# Patient Record
Sex: Male | Born: 1945 | Race: White | Hispanic: No | Marital: Married | Smoking: Former smoker
Health system: Southern US, Community
[De-identification: ages and names within clinical notes are randomized; demographics above are authoritative.]

## PROBLEM LIST (undated history)

## (undated) DIAGNOSIS — M1990 Unspecified osteoarthritis, unspecified site: Secondary | ICD-10-CM

## (undated) DIAGNOSIS — J189 Pneumonia, unspecified organism: Secondary | ICD-10-CM

## (undated) DIAGNOSIS — C4490 Unspecified malignant neoplasm of skin, unspecified: Secondary | ICD-10-CM

## (undated) DIAGNOSIS — R42 Dizziness and giddiness: Secondary | ICD-10-CM

## (undated) DIAGNOSIS — H9190 Unspecified hearing loss, unspecified ear: Secondary | ICD-10-CM

## (undated) DIAGNOSIS — K219 Gastro-esophageal reflux disease without esophagitis: Secondary | ICD-10-CM

## (undated) DIAGNOSIS — K5790 Diverticulosis of intestine, part unspecified, without perforation or abscess without bleeding: Secondary | ICD-10-CM

## (undated) DIAGNOSIS — I2510 Atherosclerotic heart disease of native coronary artery without angina pectoris: Secondary | ICD-10-CM

## (undated) DIAGNOSIS — Z8619 Personal history of other infectious and parasitic diseases: Secondary | ICD-10-CM

## (undated) DIAGNOSIS — I10 Essential (primary) hypertension: Secondary | ICD-10-CM

## (undated) HISTORY — DX: Dizziness and giddiness: R42

## (undated) HISTORY — PX: HAND SURGERY: SHX662

## (undated) HISTORY — PX: KNEE SURGERY: SHX244

## (undated) HISTORY — DX: Atherosclerotic heart disease of native coronary artery without angina pectoris: I25.10

## (undated) HISTORY — PX: SHOULDER SURGERY: SHX246

## (undated) HISTORY — PX: FOOT SURGERY: SHX648

## (undated) HISTORY — DX: Essential (primary) hypertension: I10

## (undated) HISTORY — DX: Unspecified malignant neoplasm of skin, unspecified: C44.90

## (undated) HISTORY — PX: ELBOW SURGERY: SHX618

---
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
## 1998-10-23 DIAGNOSIS — Z8619 Personal history of other infectious and parasitic diseases: Secondary | ICD-10-CM

## 1998-10-23 HISTORY — DX: Personal history of other infectious and parasitic diseases: Z86.19

## 2005-05-12 HISTORY — PX: HERNIA REPAIR: SHX51

## 2013-10-23 DIAGNOSIS — C4490 Unspecified malignant neoplasm of skin, unspecified: Secondary | ICD-10-CM

## 2013-10-23 HISTORY — PX: SKIN CANCER EXCISION: SHX779

## 2013-10-23 HISTORY — DX: Unspecified malignant neoplasm of skin, unspecified: C44.90

## 2018-05-14 ENCOUNTER — Encounter: Payer: Self-pay | Admitting: General Surgery

## 2018-05-29 ENCOUNTER — Encounter: Payer: Self-pay | Admitting: *Deleted

## 2018-06-04 ENCOUNTER — Encounter: Payer: Self-pay | Admitting: General Surgery

## 2018-06-04 ENCOUNTER — Ambulatory Visit: Payer: Medicare Other | Admitting: General Surgery

## 2018-06-04 VITALS — BP 132/76 | HR 65 | Resp 16 | Ht 71.0 in | Wt 209.0 lb

## 2018-06-04 DIAGNOSIS — K4091 Unilateral inguinal hernia, without obstruction or gangrene, recurrent: Secondary | ICD-10-CM | POA: Diagnosis not present

## 2018-06-04 NOTE — Patient Instructions (Addendum)
The patient is aware to call back for any questions or new concerns. Sign release for previous hernia surgery. [facility].  Inguinal Hernia, Adult An inguinal hernia is when fat or the intestines push through the area where the leg meets the lower belly (groin) and make a rounded lump (bulge). This condition happens over time. There are three types of inguinal hernias. These types include:  Hernias that can be pushed back into the belly (are reducible).  Hernias that cannot be pushed back into the belly (are incarcerated).  Hernias that cannot be pushed back into the belly and lose their blood supply (get strangulated). This type needs emergency surgery.  Follow these instructions at home: Lifestyle  Drink enough fluid to keep your urine (pee) clear or pale yellow.  Eat plenty of fruits, vegetables, and whole grains. These have a lot of fiber. Talk with your doctor if you have questions.  Avoid lifting heavy objects.  Avoid standing for long periods of time.  Do not use tobacco products. These include cigarettes, chewing tobacco, or e-cigarettes. If you need help quitting, ask your doctor.  Try to stay at a healthy weight. General instructions  Do not try to force the hernia back in.  Watch your hernia for any changes in color or size. Let your doctor know if there are any changes.  Take over-the-counter and prescription medicines only as told by your doctor.  Keep all follow-up visits as told by your doctor. This is important. Contact a doctor if:  You have a fever.  You have new symptoms.  Your symptoms get worse. Get help right away if:  The area where the legs meets the lower belly has: ? Pain that gets worse suddenly. ? A bulge that gets bigger suddenly and does not go down. ? A bulge that turns red or purple. ? A bulge that is painful to the touch.  You are a man and your scrotum: ? Suddenly feels painful. ? Suddenly changes in size.  You feel sick to  your stomach (nauseous) and this feeling does not go away.  You throw up (vomit) and this keeps happening.  You feel your heart beating a lot more quickly than normal.  You cannot poop (have a bowel movement) or pass gas. This information is not intended to replace advice given to you by your health care provider. Make sure you discuss any questions you have with your health care provider. Document Released: 11/09/2006 Document Revised: 03/16/2016 Document Reviewed: 08/19/2014 Elsevier Interactive Patient Education  [employer].   The patient is scheduled for surgery at [facility] on 06/17/18. He will pre admit by phone. The patient is aware of date and instructions.

## 2018-06-04 NOTE — Progress Notes (Signed)
Patient ID: Keith Davidson., male   DOB: 11-29-1945, 72 y.o.   MRN: [id number]  Chief Complaint  Patient presents with  . Hernia    HPI [name]. is a 72 y.o. male.  Patient here today for an evaluation of a possible hernia referred by Dr Elliot Gault.  States that he noticed left groin pain about one year ago that has progressively gotten worse.  It does seem to be causing some left groin pain described as a burning  "bee sting". He states he has been remodeling a pontoon boat and has noticed it more. He states the pain is worse with activity.  No nausea, vomiting, constipation or diarrhea noted. He is a retired Academic librarian).   HPI  Past Medical History:  Diagnosis Date  . CAD (coronary artery disease)   . Hypertension   . Skin cancer 2015   face  . Vertigo     Past Surgical History:  Procedure Laterality Date  . ELBOW SURGERY Right   . FOOT SURGERY Right    big toe  . HAND SURGERY Right    thumb and middle finger  . HERNIA REPAIR Left 05/12/2005   Laparoscopic, Proceed mesh, Milus Mallick, M.D.; [place], [place] Right    x2  . SHOULDER SURGERY Left   . SKIN CANCER EXCISION  2015   face    Family History  Problem Relation Age of Onset  . Heart attack Mother   . Heart disease Mother   . Heart disease Father   . Heart attack Father     Social History Social History   Tobacco Use  . Smoking status: Former Smoker    Years: 8.00    Types: Cigarettes    Last attempt to quit: 10/23/1978    Years since quitting: 39.6  . Smokeless tobacco: Never Used  Substance Use Topics  . Alcohol use: Yes    Comment: 2/day  . Drug use: Never    Allergies  Allergen Reactions  . Sulfa Antibiotics Rash    Current Outpatient Medications  Medication Sig Dispense Refill  . amLODipine (NORVASC) 5 MG tablet Take 5 mg by mouth daily.  1  . Betahistine HCl (BETAHISTINE DIHYDROCHLORIDE) POWD Take 24 mg by mouth daily.  [place] Compounds    . fenofibrate 54 MG tablet Take 54 mg by mouth daily.  3  . Methylcellulose, Laxative, (CITRUCEL PO) Take 1 tablet by mouth daily.     . naproxen (NAPROSYN) 500 MG tablet Take 500 mg by mouth daily.   3  . ondansetron (ZOFRAN) 4 MG tablet Take 4 mg by mouth every 8 (eight) hours as needed for nausea or vomiting.    . triamterene-hydrochlorothiazide (MAXZIDE-25) 37.5-25 MG tablet Take 1 tablet by mouth daily.  11  . diazepam (VALIUM) 2 MG tablet Take 2 mg by mouth 2 (two) times daily as needed (for dizziness/vertigo).    . Multiple Vitamin (MULTIVITAMIN WITH MINERALS) TABS tablet Take 1 tablet by mouth daily. Centrum Silver     No current facility-administered medications for this visit.     Review of Systems Review of Systems  Constitutional: Negative.   Respiratory: Negative.   Cardiovascular: Negative.   Gastrointestinal: Positive for abdominal pain. Negative for constipation, diarrhea, nausea and vomiting.    Blood pressure 132/76, pulse 65, resp. rate 16, height 5\' 11"  (1.803 m), weight 209 lb (94.8 kg), SpO2 94 %.  Physical Exam Physical Exam  Constitutional: He is oriented to person, place, and time. He appears well-developed and well-nourished.  HENT:  Mouth/Throat: No oropharyngeal exudate.  Eyes: Conjunctivae are normal. No scleral icterus.  Neck: Neck supple.  Cardiovascular: Normal rate, regular rhythm and normal heart sounds.  Pulmonary/Chest: Effort normal and breath sounds normal.  Abdominal: Soft. A hernia is present. Hernia confirmed positive in the left inguinal area.  Genitourinary: Testes normal.     Neurological: He is alert and oriented to person, place, and time.  Skin: Skin is warm and dry.  Psychiatric: His behavior is normal.    Data Reviewed Operative note from the May 04, 2005 procedure describes a large left inguinal hernia including the sigmoid colon.  A Proceed mesh was placed followed by coverage with the  peritoneum (TAPP procedure). Laboratory studies dated June 18, 2017 showed a creatinine of 1.0 with an estimated GFR of 60.  Normal electrolytes with a potassium of 4.1.  Blood sugar of 94.  CBC of the same date showed a hemoglobin of 15.4 with an MCV of 88, white blood cell count of 6400 with normal differential.  Platelet count of 277,000.  ESA of 0.8.  Liver panel: Normal.  Assessment    Recurrent left inguinal hernia.    Plan    Sign release for previous hernia surgery, [facility].  ( records received)  Hernia precautions and incarceration were discussed with the patient. If they develop symptoms of an incarcerated hernia, they were encouraged to seek prompt medical attention.  I have recommended repair of the hernia using mesh on an outpatient basis in the near future. The risk of infection was reviewed. The role of prosthetic mesh to minimize the risk of recurrence was reviewed.     HPI, Physical Exam, Assessment and Plan have been scribed under the direction and in the presence of Robert Bellow, MD. Karie Fetch, RN  I have completed the exam and reviewed the above documentation for accuracy and completeness.  I agree with the above.  Haematologist has been used and any errors in dictation or transcription are unintentional.  Hervey Ard, M.D., F.A.C.S.  The patient is scheduled for surgery at [facility] on 06/17/18. He will pre admit by phone. The patient is aware of date and instructions.  Documented by Caryl-Lyn Otis Brace LPN  [name] Wenonah Milo 06/05/2018, 6:26 PM

## 2018-06-05 ENCOUNTER — Encounter: Payer: Self-pay | Admitting: General Surgery

## 2018-06-05 DIAGNOSIS — K4091 Unilateral inguinal hernia, without obstruction or gangrene, recurrent: Secondary | ICD-10-CM | POA: Insufficient documentation

## 2018-06-07 ENCOUNTER — Encounter
Admission: RE | Admit: 2018-06-07 | Discharge: 2018-06-07 | Disposition: A | Discharge status: Home / Self Care | Payer: Medicare Other | Source: Ambulatory Visit | Attending: General Surgery | Admitting: General Surgery

## 2018-06-07 ENCOUNTER — Other Ambulatory Visit: Payer: Self-pay

## 2018-06-07 ENCOUNTER — Inpatient Hospital Stay: Admission: RE | Admit: 2018-06-07 | Payer: Self-pay | Source: Ambulatory Visit

## 2018-06-07 DIAGNOSIS — Z01818 Encounter for other preprocedural examination: Secondary | ICD-10-CM | POA: Insufficient documentation

## 2018-06-07 NOTE — Patient Instructions (Signed)
Your procedure is scheduled on: 06-17-18 MONDAY Report to Same Day Surgery 2nd floor medical mall [facility] Entrance-take elevator on left to 2nd floor.  Check in with surgery information desk.) To find out your arrival time please call [phone] between 1PM - 3PM on 06-14-18 FRIDAY  Remember: Instructions that are not followed completely may result in serious medical risk, up to and including death, or upon the discretion of your surgeon and anesthesiologist your surgery may need to be rescheduled.    _x___ 1. Do not eat food after midnight the night before your procedure. NO GUM OR CANDY AFTER MIDNIGHT.  You may drink clear liquids up to 2 hours before you are scheduled to arrive at the hospital for your procedure.  Do not drink clear liquids within 2 hours of your scheduled arrival to the hospital.  Clear liquids include  --Water or Apple juice without pulp  --Clear carbohydrate beverage such as ClearFast or Gatorade  --Black Coffee or Clear Tea (No milk, no creamers, do not add anything to the coffee or Tea   ____Ensure clear carbohydrate drink on the way to the hospital for bariatric patients  ____Ensure clear carbohydrate drink 3 hours before surgery for Dr Dwyane Luo patients if physician instructed.     __x__ 2. No Alcohol for 24 hours before or after surgery.   __x__3. No Smoking or e-cigarettes for 24 prior to surgery.  Do not use any chewable tobacco products for at least 6 hour prior to surgery   ____  4. Bring all medications with you on the day of surgery if instructed.    __x__ 5. Notify your doctor if there is any change in your medical condition     (cold, fever, infections).    x___6. On the morning of surgery brush your teeth with toothpaste and water.  You may rinse your mouth with mouth wash if you wish.  Do not swallow any toothpaste or mouthwash.   Do not wear jewelry, make-up, hairpins, clips or nail polish.  Do not wear lotions, powders, or perfumes. You  may wear deodorant.  Do not shave 48 hours prior to surgery. Men may shave face and neck.  Do not bring valuables to the hospital.    [facility] is not responsible for any belongings or valuables.               Contacts, dentures or bridgework may not be worn into surgery.  Leave your suitcase in the car. After surgery it may be brought to your room.  For patients admitted to the hospital, discharge time is determined by your  treatment team.  _  Patients discharged the day of surgery will not be allowed to drive home.  You will need someone to drive you home and stay with you the night of your procedure.    Please read over the following fact sheets that you were given:   [facility] Preparing for Surgery and or MRSA Information   _x___ TAKE THE FOLLOWING MEDICATION THE MORNING OF SURGERY WITH A SMALL SIP OF WATER. These include:  1. AMLODIPINE (NORVASC)  2. FENOFIBRATE  3.  4.  5.  6.  ____Fleets enema or Magnesium Citrate as directed.   _x___ Use CHG Soap or sage wipes as directed on instruction sheet   ____ Use inhalers on the day of surgery and bring to hospital day of surgery  ____ Stop Metformin and Janumet 2 days prior to surgery.    ____ Take 1/2  of usual insulin dose the night before surgery and none on the morning surgery.   ____ Follow recommendations from Cardiologist, Pulmonologist or PCP regarding stopping Aspirin, Coumadin, Plavix ,Eliquis, Effient, or Pradaxa, and Pletal.  ____Stop Anti-inflammatories such as Advil, Aleve, Ibuprofen, Motrin, Naproxen, Naprosyn, Goodies powders or aspirin products. OK to take Tylenol    ____ Stop supplements until after surgery.     ____ Bring C-Pap to the hospital.

## 2018-06-10 ENCOUNTER — Inpatient Hospital Stay: Admission: RE | Admit: 2018-06-10 | Payer: Self-pay | Source: Ambulatory Visit

## 2018-06-11 ENCOUNTER — Encounter
Admission: RE | Admit: 2018-06-11 | Discharge: 2018-06-11 | Disposition: A | Discharge status: Home / Self Care | Payer: Medicare Other | Source: Ambulatory Visit | Attending: General Surgery | Admitting: General Surgery

## 2018-06-11 ENCOUNTER — Ambulatory Visit: Payer: Self-pay | Admitting: General Surgery

## 2018-06-11 DIAGNOSIS — Z01812 Encounter for preprocedural laboratory examination: Secondary | ICD-10-CM | POA: Insufficient documentation

## 2018-06-11 DIAGNOSIS — I10 Essential (primary) hypertension: Secondary | ICD-10-CM | POA: Insufficient documentation

## 2018-06-11 DIAGNOSIS — I2510 Atherosclerotic heart disease of native coronary artery without angina pectoris: Secondary | ICD-10-CM | POA: Insufficient documentation

## 2018-06-11 DIAGNOSIS — Z01810 Encounter for preprocedural cardiovascular examination: Secondary | ICD-10-CM | POA: Insufficient documentation

## 2018-06-11 LAB — CBC
HEMATOCRIT: 45.7 % (ref 40.0–52.0)
Hemoglobin: 15.8 g/dL (ref 13.0–18.0)
MCH: 30.9 pg (ref 26.0–34.0)
MCHC: 34.5 g/dL (ref 32.0–36.0)
MCV: 89.5 fL (ref 80.0–100.0)
Platelets: 276 10*3/uL (ref 150–440)
RBC: 5.11 MIL/uL (ref 4.40–5.90)
RDW: 13.4 % (ref 11.5–14.5)
WBC: 7.7 10*3/uL (ref 3.8–10.6)

## 2018-06-11 LAB — BASIC METABOLIC PANEL
ANION GAP: 7 (ref 5–15)
BUN: 21 mg/dL (ref 8–23)
CHLORIDE: 102 mmol/L (ref 98–111)
CO2: 30 mmol/L (ref 22–32)
Calcium: 9.7 mg/dL (ref 8.9–10.3)
Creatinine, Ser: 1.29 mg/dL — ABNORMAL HIGH (ref 0.61–1.24)
GFR calc non Af Amer: 54 mL/min — ABNORMAL LOW (ref >60)
GLUCOSE: 97 mg/dL (ref 70–99)
POTASSIUM: 4.1 mmol/L (ref 3.5–5.1)
Sodium: 139 mmol/L (ref 135–145)

## 2018-06-14 ENCOUNTER — Telehealth: Payer: Self-pay | Admitting: *Deleted

## 2018-06-14 NOTE — Telephone Encounter (Signed)
Notified patient as instructed, patient agrees. Patients labs was faxed to PCP

## 2018-06-14 NOTE — Telephone Encounter (Signed)
-----   Message from Robert Bellow, MD sent at 06/14/2018  8:46 AM EDT ----- Forward copy of labs to PCP. Notify patient kidney function down a little from last year. He will need to discuss w/ his PCP post hernia repair.  ----- Message ----- From: Interface, Lab In [facility] [place] Sent: 06/11/2018   1:02 PM EDT To: Robert Bellow, MD

## 2018-06-16 MED ORDER — CEFAZOLIN SODIUM-DEXTROSE 2-4 GM/100ML-% IV SOLN: 2.0000 g | INTRAVENOUS | Status: AC

## 2018-06-17 ENCOUNTER — Ambulatory Visit: Payer: Medicare Other | Admitting: Anesthesiology

## 2018-06-17 ENCOUNTER — Other Ambulatory Visit: Payer: Self-pay

## 2018-06-17 ENCOUNTER — Encounter
Admission: RE | Disposition: A | Discharge status: Home / Self Care | Payer: Self-pay | Source: Ambulatory Visit | Attending: General Surgery

## 2018-06-17 ENCOUNTER — Ambulatory Visit
Admission: RE | Admit: 2018-06-17 | Discharge: 2018-06-17 | Disposition: A | Discharge status: Home / Self Care | Payer: Medicare Other | Source: Ambulatory Visit | Attending: General Surgery | Admitting: General Surgery

## 2018-06-17 DIAGNOSIS — I2510 Atherosclerotic heart disease of native coronary artery without angina pectoris: Secondary | ICD-10-CM | POA: Insufficient documentation

## 2018-06-17 DIAGNOSIS — D176 Benign lipomatous neoplasm of spermatic cord: Secondary | ICD-10-CM | POA: Insufficient documentation

## 2018-06-17 DIAGNOSIS — Z85828 Personal history of other malignant neoplasm of skin: Secondary | ICD-10-CM | POA: Diagnosis not present

## 2018-06-17 DIAGNOSIS — K4090 Unilateral inguinal hernia, without obstruction or gangrene, not specified as recurrent: Secondary | ICD-10-CM

## 2018-06-17 DIAGNOSIS — Z79899 Other long term (current) drug therapy: Secondary | ICD-10-CM | POA: Diagnosis not present

## 2018-06-17 DIAGNOSIS — R42 Dizziness and giddiness: Secondary | ICD-10-CM | POA: Insufficient documentation

## 2018-06-17 DIAGNOSIS — Z8249 Family history of ischemic heart disease and other diseases of the circulatory system: Secondary | ICD-10-CM | POA: Insufficient documentation

## 2018-06-17 DIAGNOSIS — I10 Essential (primary) hypertension: Secondary | ICD-10-CM | POA: Diagnosis not present

## 2018-06-17 DIAGNOSIS — K4091 Unilateral inguinal hernia, without obstruction or gangrene, recurrent: Secondary | ICD-10-CM

## 2018-06-17 HISTORY — PX: INGUINAL HERNIA REPAIR: SHX194

## 2018-06-17 SURGERY — REPAIR, HERNIA, INGUINAL, ADULT
Anesthesia: General | Laterality: Left | Wound class: Clean

## 2018-06-17 MED ORDER — FENTANYL CITRATE (PF) 100 MCG/2ML IJ SOLN
INTRAMUSCULAR | Status: AC
  Filled 2018-06-17: qty 2

## 2018-06-17 MED ORDER — ONDANSETRON HCL 4 MG/2ML IJ SOLN
INTRAMUSCULAR | Status: DC | PRN
  Administered 2018-06-17: 4 mg via INTRAVENOUS

## 2018-06-17 MED ORDER — BUPIVACAINE-EPINEPHRINE (PF) 0.5% -1:200000 IJ SOLN
INTRAMUSCULAR | Status: DC | PRN
  Administered 2018-06-17: 30 mL

## 2018-06-17 MED ORDER — MIDAZOLAM HCL 2 MG/2ML IJ SOLN
INTRAMUSCULAR | Status: DC | PRN
  Administered 2018-06-17: 2 mg via INTRAVENOUS

## 2018-06-17 MED ORDER — GABAPENTIN 300 MG PO CAPS
300.0000 mg | ORAL_CAPSULE | ORAL | Status: AC
  Administered 2018-06-17: 300 mg via ORAL

## 2018-06-17 MED ORDER — FENTANYL CITRATE (PF) 100 MCG/2ML IJ SOLN
INTRAMUSCULAR | Status: DC | PRN
  Administered 2018-06-17: 50 ug via INTRAVENOUS

## 2018-06-17 MED ORDER — CEFAZOLIN SODIUM-DEXTROSE 1-4 GM/50ML-% IV SOLN
INTRAVENOUS | Status: DC | PRN
  Administered 2018-06-17: 2 g via INTRAVENOUS

## 2018-06-17 MED ORDER — ACETAMINOPHEN 10 MG/ML IV SOLN
INTRAVENOUS | Status: DC | PRN
  Administered 2018-06-17: 1000 mg via INTRAVENOUS

## 2018-06-17 MED ORDER — OXYCODONE HCL 5 MG PO TABS: 5.0000 mg | ORAL_TABLET | Freq: Once | ORAL | Status: DC | PRN

## 2018-06-17 MED ORDER — LACTATED RINGERS IV SOLN
INTRAVENOUS | Status: DC
  Administered 2018-06-17 (×2): via INTRAVENOUS

## 2018-06-17 MED ORDER — GABAPENTIN 300 MG PO CAPS
ORAL_CAPSULE | ORAL | Status: AC
  Filled 2018-06-17: qty 1

## 2018-06-17 MED ORDER — CELECOXIB 200 MG PO CAPS: 200.0000 mg | ORAL_CAPSULE | ORAL | Status: DC

## 2018-06-17 MED ORDER — HYDROCODONE-ACETAMINOPHEN 5-325 MG PO TABS: 1.0000 | ORAL_TABLET | ORAL | 0 refills | Status: DC | PRN

## 2018-06-17 MED ORDER — BUPIVACAINE-EPINEPHRINE (PF) 0.5% -1:200000 IJ SOLN
INTRAMUSCULAR | Status: AC
  Filled 2018-06-17: qty 30

## 2018-06-17 MED ORDER — PROPOFOL 10 MG/ML IV BOLUS
INTRAVENOUS | Status: DC | PRN
  Administered 2018-06-17: 120 mg via INTRAVENOUS

## 2018-06-17 MED ORDER — OXYCODONE HCL 5 MG/5ML PO SOLN: 5.0000 mg | Freq: Once | ORAL | Status: DC | PRN

## 2018-06-17 MED ORDER — FAMOTIDINE 20 MG PO TABS
20.0000 mg | ORAL_TABLET | Freq: Once | ORAL | Status: AC
  Administered 2018-06-17: 20 mg via ORAL

## 2018-06-17 MED ORDER — PROPOFOL 10 MG/ML IV BOLUS
INTRAVENOUS | Status: AC
  Filled 2018-06-17: qty 20

## 2018-06-17 MED ORDER — ACETAMINOPHEN 10 MG/ML IV SOLN
INTRAVENOUS | Status: AC
  Filled 2018-06-17: qty 100

## 2018-06-17 MED ORDER — FENTANYL CITRATE (PF) 100 MCG/2ML IJ SOLN: 25.0000 ug | INTRAMUSCULAR | Status: DC | PRN

## 2018-06-17 MED ORDER — LIDOCAINE HCL (CARDIAC) PF 100 MG/5ML IV SOSY
PREFILLED_SYRINGE | INTRAVENOUS | Status: DC | PRN
  Administered 2018-06-17: 40 mg via INTRAVENOUS

## 2018-06-17 MED ORDER — FAMOTIDINE 20 MG PO TABS
ORAL_TABLET | ORAL | Status: AC
  Filled 2018-06-17: qty 1

## 2018-06-17 MED ORDER — MIDAZOLAM HCL 2 MG/2ML IJ SOLN
INTRAMUSCULAR | Status: AC
  Filled 2018-06-17: qty 2

## 2018-06-17 SURGICAL SUPPLY — 33 items
BLADE SURG 15 STRL SS SAFETY (BLADE) ×6 IMPLANT
CANISTER SUCT 1200ML W/VALVE (MISCELLANEOUS) ×3 IMPLANT
CHLORAPREP W/TINT 26ML (MISCELLANEOUS) ×3 IMPLANT
CLOSURE WOUND 1/2 X4 (GAUZE/BANDAGES/DRESSINGS) ×1
DECANTER SPIKE VIAL GLASS SM (MISCELLANEOUS) ×3 IMPLANT
DRAIN PENROSE 1/4X12 LTX (DRAIN) ×3 IMPLANT
DRAPE LAPAROTOMY 100X77 ABD (DRAPES) ×3 IMPLANT
DRSG TEGADERM 4X4.75 (GAUZE/BANDAGES/DRESSINGS) ×3 IMPLANT
DRSG TELFA 4X3 1S NADH ST (GAUZE/BANDAGES/DRESSINGS) ×3 IMPLANT
ELECT REM PT RETURN 9FT ADLT (ELECTROSURGICAL) ×3
ELECTRODE REM PT RTRN 9FT ADLT (ELECTROSURGICAL) ×1 IMPLANT
GLOVE BIO SURGEON STRL SZ7.5 (GLOVE) ×3 IMPLANT
GLOVE INDICATOR 8.0 STRL GRN (GLOVE) ×3 IMPLANT
GOWN STRL REUS W/ TWL LRG LVL3 (GOWN DISPOSABLE) ×2 IMPLANT
GOWN STRL REUS W/TWL LRG LVL3 (GOWN DISPOSABLE) ×4
KIT TURNOVER KIT A (KITS) ×3 IMPLANT
LABEL OR SOLS (LABEL) ×3 IMPLANT
MESH MARLEX PLUG MEDIUM (Mesh General) ×3 IMPLANT
NEEDLE HYPO 22GX1.5 SAFETY (NEEDLE) ×6 IMPLANT
PACK BASIN MINOR ARMC (MISCELLANEOUS) ×3 IMPLANT
STRIP CLOSURE SKIN 1/2X4 (GAUZE/BANDAGES/DRESSINGS) ×2 IMPLANT
SUT PDS AB 0 CT1 27 (SUTURE) ×3 IMPLANT
SUT SURGILON 0 BLK (SUTURE) ×6 IMPLANT
SUT VIC AB 2-0 SH 27 (SUTURE) ×2
SUT VIC AB 2-0 SH 27XBRD (SUTURE) ×1 IMPLANT
SUT VIC AB 3-0 54X BRD REEL (SUTURE) ×1 IMPLANT
SUT VIC AB 3-0 BRD 54 (SUTURE) ×2
SUT VIC AB 3-0 SH 27 (SUTURE) ×2
SUT VIC AB 3-0 SH 27X BRD (SUTURE) ×1 IMPLANT
SUT VIC AB 4-0 FS2 27 (SUTURE) ×3 IMPLANT
SWABSTK COMLB BENZOIN TINCTURE (MISCELLANEOUS) ×3 IMPLANT
SYR 10ML LL (SYRINGE) ×3 IMPLANT
SYR 3ML LL SCALE MARK (SYRINGE) ×3 IMPLANT

## 2018-06-17 NOTE — Op Note (Signed)
Preoperative diagnosis: Left inguinal hernia.  Postoperative diagnosis: Large lipoma the cord.  Operative procedure: Excision of lipoma the cord, buttressing of the inguinal canal with a medium PerFix plug and mesh.  Operating Surgeon: Hervey Ard, MD.  Anesthesia: General by LMA, Marcaine 0.5% with 1 to 200,000 units of epinephrine, 30 cc.  Clinical note: This 72 year old male is developed a bulge in the left groin and exam consistent with an inguinal hernia.  He is mainly had burning discomfort thought secondary to traction on the ilioinguinal and iliohypogastric nerves.  He was admitted for elective repair.  He received Kefzol prior to the procedure.  Operative note: Hair would previously been removed from the surgical site.  SCD stockings for DVT prevention.  The abdomen was cleansed with ChloraPrep and draped.  A 5 cm skin line incision was made over the anticipated course the inguinal canal after a field block anesthetic was completed.  The skin was incised sharply.  Hemostasis was electrocautery and 3-0 Vicryl ties.  The external Bleich was opened in the direction of its fibers.  There was a very large lipoma the cord which was dissected back to the level of the internal inguinal ring at which point it was cleared, divided between [employer] clamps and hemostasis achieved with 3-0 Vicryl ties.  Dissection of the cord showed no indirect sac.  The medial floor the inguinal canal was intact.  A PerFix plug was placed through the internal ring and anchored with a single 0 Surgilon figure-of-eight sutures.  An onlay mesh was then placed anchored to the pubic tubercle in a similar fashion with 0 Surgilon.  The inferior border was anchored to the inguinal ligament and the superior medial aspects of the transverse abdominis aponeurosis.  Toradol was placed in the wound for postoperative analgesia.  The external Bleich was closed with a running 2-0 Vicryl suture.  Scarpa's fascia was closed with a running  3-0 Vicryl suture.  The skin closed with a running 4-0 Vicryl subcuticular suture.  Benzoin, Steri-Strips followed by Telfa and Tegaderm dressing applied.  The patient tolerated the procedure well and was taken to the recovery room in stable condition.

## 2018-06-17 NOTE — Progress Notes (Signed)
Dr. Bary Castilla into see

## 2018-06-17 NOTE — Transfer of Care (Signed)
Immediate Anesthesia Transfer of Care Note  Patient: Keith Davidson.  Procedure(s) Performed: HERNIA REPAIR INGUINAL ADULT (Left )  Patient Location: PACU  Anesthesia Type:General  Level of Consciousness: drowsy  Airway & Oxygen Therapy: Patient Spontanous Breathing and Patient connected to face mask oxygen  Post-op Assessment: Report given to RN and Post -op Vital signs reviewed and stable  Post vital signs: Reviewed and stable  Last Vitals:  Vitals Value Taken Time  BP 129/75 06/17/2018  1:26 PM  Temp    Pulse 68 06/17/2018  1:27 PM  Resp 13 06/17/2018  1:27 PM  SpO2 100 % 06/17/2018  1:27 PM  Vitals shown include unvalidated device data.  Last Pain:  Vitals:   06/17/18 1324  TempSrc:   PainSc: (P) 0-No pain         Complications: No apparent anesthesia complications

## 2018-06-17 NOTE — H&P (Signed)
No change in clinical history or exam. For left inguinal hernia repair.

## 2018-06-17 NOTE — Anesthesia Preprocedure Evaluation (Signed)
Anesthesia Evaluation  Patient identified by MRN, date of birth, ID band Patient awake    Reviewed: Allergy & Precautions, H&P , NPO status , Patient's Chart, lab work & pertinent test results  History of Anesthesia Complications Negative for: history of anesthetic complications  Airway Mallampati: II  TM Distance: <3 FB Neck ROM: limited    Dental  (+) Chipped, Poor Dentition   Pulmonary neg shortness of breath, former smoker,           Cardiovascular Exercise Tolerance: Good hypertension, (-) angina+ CAD  (-) DOE      Neuro/Psych negative neurological ROS  negative psych ROS   GI/Hepatic negative GI ROS, Neg liver ROS,   Endo/Other  negative endocrine ROS  Renal/GU      Musculoskeletal   Abdominal   Peds  Hematology negative hematology ROS (+)   Anesthesia Other Findings Past Medical History: No date: CAD (coronary artery disease) No date: Hypertension 2015: Skin cancer     Comment:  face No date: Vertigo  Past Surgical History: No date: ELBOW SURGERY; Right No date: FOOT SURGERY; Right     Comment:  big toe No date: HAND SURGERY; Right     Comment:  thumb and middle finger 05/12/2005: HERNIA REPAIR; Left     Comment:  Laparoscopic, Proceed mesh, Milus Mallick, M.D.; [place], [place] No date: KNEE SURGERY; Right     Comment:  x2 No date: SHOULDER SURGERY; Left 2015: SKIN CANCER EXCISION     Comment:  face  BMI    Body Mass Index:  29.15 kg/m      Reproductive/Obstetrics negative OB ROS                             Anesthesia Physical Anesthesia Plan  ASA: III  Anesthesia Plan: General LMA   Post-op Pain Management:    Induction: Intravenous  PONV Risk Score and Plan: Ondansetron, Dexamethasone, Midazolam and Treatment may vary due to age or medical condition  Airway Management Planned: LMA  Additional Equipment:   Intra-op  Plan:   Post-operative Plan: Extubation in OR  Informed Consent: I have reviewed the patients History and Physical, chart, labs and discussed the procedure including the risks, benefits and alternatives for the proposed anesthesia with the patient or authorized representative who has indicated his/her understanding and acceptance.   Dental Advisory Given  Plan Discussed with: Anesthesiologist, CRNA and Surgeon  Anesthesia Plan Comments: (Patient consented for risks of anesthesia including but not limited to:  - adverse reactions to medications - damage to teeth, lips or other oral mucosa - sore throat or hoarseness - Damage to heart, brain, lungs or loss of life  Patient voiced understanding.)        Anesthesia Quick Evaluation

## 2018-06-17 NOTE — Discharge Instructions (Signed)

## 2018-06-17 NOTE — Anesthesia Post-op Follow-up Note (Signed)
Anesthesia QCDR form completed.        

## 2018-06-17 NOTE — Anesthesia Procedure Notes (Signed)
Procedure Name: LMA Insertion Date/Time: 06/17/2018 12:26 PM Performed by: Gentry Fitz, CRNA Pre-anesthesia Checklist: Patient identified, Emergency Drugs available, Suction available, Patient being monitored and Timeout performed Patient Re-evaluated:Patient Re-evaluated prior to induction Oxygen Delivery Method: Circle system utilized Preoxygenation: Pre-oxygenation with 100% oxygen Induction Type: IV induction Ventilation: Mask ventilation without difficulty Placement Confirmation: positive ETCO2 Dental Injury: Teeth and Oropharynx as per pre-operative assessment

## 2018-06-18 ENCOUNTER — Encounter: Payer: Self-pay | Admitting: General Surgery

## 2018-06-18 NOTE — Anesthesia Postprocedure Evaluation (Signed)
Anesthesia Post Note  Patient: Keith Davidson.  Procedure(s) Performed: HERNIA REPAIR INGUINAL ADULT (Left )  Patient location during evaluation: PACU Anesthesia Type: General Level of consciousness: awake and alert Pain management: pain level controlled Vital Signs Assessment: post-procedure vital signs reviewed and stable Respiratory status: spontaneous breathing, nonlabored ventilation, respiratory function stable and patient connected to nasal cannula oxygen Cardiovascular status: blood pressure returned to baseline and stable Postop Assessment: no apparent nausea or vomiting Anesthetic complications: no     Last Vitals:  Vitals:   06/17/18 1449 06/17/18 1514  BP: 125/73 125/77  Pulse: 64 65  Resp: 16   Temp: (!) 35.9 C   SpO2: 94% 98%    Last Pain:  Vitals:   06/17/18 1514  TempSrc:   PainSc: 0-No pain                 Precious Haws Taneasha Fuqua

## 2018-06-20 ENCOUNTER — Ambulatory Visit: Payer: Self-pay | Admitting: General Surgery

## 2018-06-25 ENCOUNTER — Encounter: Payer: Self-pay | Admitting: General Surgery

## 2018-06-25 ENCOUNTER — Ambulatory Visit (INDEPENDENT_AMBULATORY_CARE_PROVIDER_SITE_OTHER): Payer: Medicare Other | Admitting: General Surgery

## 2018-06-25 VITALS — BP 128/70 | HR 70 | Resp 12 | Ht 71.0 in | Wt 213.0 lb

## 2018-06-25 DIAGNOSIS — D171 Benign lipomatous neoplasm of skin and subcutaneous tissue of trunk: Secondary | ICD-10-CM

## 2018-06-25 NOTE — Progress Notes (Signed)
Patient ID: Keith Craun., male   DOB: [birth year], 72 y.o.   MRN: [id number]  Chief Complaint  Patient presents with  . Routine Post Op    HPI Keith Blazier. is a 72 y.o. male here today for his post op left inguinal hernia repair done on 06/17/2018. Patient states he is very sore and it feel like a sharpe stabbing pain in his left inguinal area. Moving his bowels daily. He has been using a heating pad daily.   Past Medical History:  Diagnosis Date  . CAD (coronary artery disease)   . Hypertension   . Skin cancer 2015   face  . Vertigo     Past Surgical History:  Procedure Laterality Date  . ELBOW SURGERY Right   . FOOT SURGERY Right    big toe  . HAND SURGERY Right    thumb and middle finger  . HERNIA REPAIR Left 05/12/2005   Laparoscopic, Proceed mesh, Milus Mallick, M.D.; [place], [place] Left 06/17/2018   Left groin exploration with resection of large lipoma the cord.  Medium PerFix plug Surgeon: Robert Bellow, MD;  Location: [facility] ORS;  Service: General;  Laterality: Left;  . KNEE SURGERY Right    x2  . SHOULDER SURGERY Left   . SKIN CANCER EXCISION  2015   face    Family History  Problem Relation Age of Onset  . Heart attack Mother   . Heart disease Mother   . Heart disease Father   . Heart attack Father     Social History Social History   Tobacco Use  . Smoking status: Former Smoker    Years: 8.00    Types: Cigarettes    Last attempt to quit: 10/23/1978    Years since quitting: 39.6  . Smokeless tobacco: Never Used  . Tobacco comment: weekends only  Substance Use Topics  . Alcohol use: Yes    Comment: wine 3-4 times week  . Drug use: Never    Allergies  Allergen Reactions  . Celebrex [Celecoxib] Rash  . Sulfa Antibiotics Rash    Current Outpatient Medications  Medication Sig Dispense Refill  . amLODipine (NORVASC) 5 MG tablet Take 5 mg by mouth every morning.   1  . Betahistine HCl  (BETAHISTINE DIHYDROCHLORIDE) POWD Take 24 mg by mouth daily. [place] Compounds    . diazepam (VALIUM) 2 MG tablet Take 2 mg by mouth 2 (two) times daily as needed (for dizziness/vertigo).    . fenofibrate 54 MG tablet Take 54 mg by mouth every morning.   3  . Methylcellulose, Laxative, (CITRUCEL PO) Take 1 tablet by mouth daily.     . Multiple Vitamin (MULTIVITAMIN WITH MINERALS) TABS tablet Take 1 tablet by mouth daily. Centrum Silver    . naproxen (NAPROSYN) 500 MG tablet Take 500 mg by mouth daily.   3  . ondansetron (ZOFRAN) 4 MG tablet Take 4 mg by mouth every 8 (eight) hours as needed for nausea or vomiting.    . triamterene-hydrochlorothiazide (MAXZIDE-25) 37.5-25 MG tablet Take 1 tablet by mouth daily.  11   No current facility-administered medications for this visit.     Review of Systems Review of Systems  Constitutional: Negative.   Respiratory: Negative.   Cardiovascular: Negative.     Blood pressure 128/70, pulse 70, resp. rate 12, height 5\' 11"  (1.803 m), weight 213 lb (96.6 kg).  Physical Exam Physical Exam  Constitutional: He is oriented  to person, place, and time. He appears well-developed and well-nourished.  Genitourinary:     Neurological: He is alert and oriented to person, place, and time.  Skin: Skin is warm and dry.    Data Reviewed Careful dissection of the internal ring showed no indirect hernia recurrence but a very large lipoma the cord which was resected and discarded.  No medial floor weakness.  Medium PerFix plug placed.  Assessment    Doing well post left groin exploration with resection of large lipoma.    Plan  Continue local heat for comfort. Proper lifting techniques reviewed.Return in one month.  The patient is aware to call back for any questions or concerns.   HPI, Physical Exam, Assessment and Plan have been scribed under the direction and in the presence of Hervey Ard, MD.  Gaspar Cola, CMA  I have  completed the exam and reviewed the above documentation for accuracy and completeness.  I agree with the above.  Haematologist has been used and any errors in dictation or transcription are unintentional.  Hervey Ard, M.D., F.A.C.S.  Keith Davidson 06/25/2018, 9:01 PM

## 2018-06-25 NOTE — Patient Instructions (Signed)
Proper lifting techniques reviewed.Return in one month.  The patient is aware to call back for any questions or concerns.

## 2018-07-25 ENCOUNTER — Ambulatory Visit (INDEPENDENT_AMBULATORY_CARE_PROVIDER_SITE_OTHER): Payer: Medicare Other | Admitting: General Surgery

## 2018-07-25 ENCOUNTER — Encounter: Payer: Self-pay | Admitting: General Surgery

## 2018-07-25 VITALS — BP 122/76 | HR 74 | Resp 14 | Ht 71.0 in | Wt 208.0 lb

## 2018-07-25 DIAGNOSIS — K4091 Unilateral inguinal hernia, without obstruction or gangrene, recurrent: Secondary | ICD-10-CM

## 2018-07-25 NOTE — Patient Instructions (Signed)
Use proper lifting techniques Follow up as needed.

## 2018-07-25 NOTE — Progress Notes (Signed)
Patient ID: Keith Davidson., male   DOB: Jun 27, 1946, 72 y.o.   MRN: [id number]  Chief Complaint  Patient presents with  . Routine Post Op    HPI Keith Davidson. is a 72 y.o. male here for a post op follow up from Left inguinal hernia repair done on 06/17/18. He states that the pain and discomfort is just about gone.  HPI  Past Medical History:  Diagnosis Date  . CAD (coronary artery disease)   . Hypertension   . Skin cancer 2015   face  . Vertigo     Past Surgical History:  Procedure Laterality Date  . ELBOW SURGERY Right   . FOOT SURGERY Right    big toe  . HAND SURGERY Right    thumb and middle finger  . HERNIA REPAIR Left 05/12/2005   Laparoscopic, Proceed mesh, Keith Davidson, M.D.; [place], [place] Left 06/17/2018   Left groin exploration with resection of large lipoma the cord.  Medium PerFix plug Surgeon: Keith Bellow, MD;  Location: [facility] ORS;  Service: General;  Laterality: Left;  . KNEE SURGERY Right    x2  . SHOULDER SURGERY Left   . SKIN CANCER EXCISION  2015   face    Family History  Problem Relation Age of Onset  . Heart attack Mother   . Heart disease Mother   . Heart disease Father   . Heart attack Father     Social History Social History   Tobacco Use  . Smoking status: Former Smoker    Years: 8.00    Types: Cigarettes    Last attempt to quit: 10/23/1978    Years since quitting: 39.7  . Smokeless tobacco: Never Used  . Tobacco comment: weekends only  Substance Use Topics  . Alcohol use: Yes    Comment: wine 3-4 times week  . Drug use: Never    Allergies  Allergen Reactions  . Celebrex [Celecoxib] Rash  . Sulfa Antibiotics Rash    Current Outpatient Medications  Medication Sig Dispense Refill  . amLODipine (NORVASC) 5 MG tablet Take 5 mg by mouth every morning.   1  . Betahistine HCl (BETAHISTINE DIHYDROCHLORIDE) POWD Take 24 mg by mouth daily. [place] [employer]    .  diazepam (VALIUM) 2 MG tablet Take 2 mg by mouth 2 (two) times daily as needed (for dizziness/vertigo).    . fenofibrate 54 MG tablet Take 54 mg by mouth every morning.   3  . Methylcellulose, Laxative, (CITRUCEL PO) Take 1 tablet by mouth daily.     . Multiple Vitamin (MULTIVITAMIN WITH MINERALS) TABS tablet Take 1 tablet by mouth daily. Centrum Silver    . naproxen (NAPROSYN) 500 MG tablet Take 500 mg by mouth daily.   3  . ondansetron (ZOFRAN) 4 MG tablet Take 4 mg by mouth every 8 (eight) hours as needed for nausea or vomiting.    . triamterene-hydrochlorothiazide (MAXZIDE-25) 37.5-25 MG tablet Take 1 tablet by mouth daily.  11   No current facility-administered medications for this visit.     Review of Systems Review of Systems  Constitutional: Negative.   Respiratory: Negative.   Cardiovascular: Negative.     Blood pressure 122/76, pulse 74, resp. rate 14, height 5\' 11"  (1.803 m), weight 208 lb (94.3 kg).  Physical Exam Physical Exam  Constitutional: He is oriented to person, place, and time. He appears well-developed and well-nourished.  Genitourinary:     Genitourinary  Comments: Well healing right inguinal hernia repair.   Neurological: He is alert and oriented to person, place, and time.  Skin: Skin is warm and dry.  Psychiatric: He has a normal mood and affect.       Assessment    Doing well post left inguinal hernia repair.    Plan    Proper lifting technique reviewed.    Follow up as needed.  HPI, Physical Exam, Assessment and Plan have been scribed under the direction and in the presence of Keith Bellow, MD  Keith Living, LPN  I have completed the exam and reviewed the above documentation for accuracy and completeness.  I agree with the above.  Keith Davidson has been used and any errors in dictation or transcription are unintentional.  Keith Davidson, M.D., F.A.C.S.   Keith Davidson Keith Davidson, 6:29 PM

## 2019-02-13 ENCOUNTER — Telehealth: Payer: Self-pay | Admitting: Cardiovascular Disease

## 2019-02-13 NOTE — Telephone Encounter (Signed)

## 2019-02-23 DIAGNOSIS — H8109 Meniere's disease, unspecified ear: Secondary | ICD-10-CM | POA: Insufficient documentation

## 2019-02-23 DIAGNOSIS — E785 Hyperlipidemia, unspecified: Secondary | ICD-10-CM | POA: Insufficient documentation

## 2019-02-23 DIAGNOSIS — R0789 Other chest pain: Principal | ICD-10-CM

## 2019-02-23 DIAGNOSIS — R079 Chest pain, unspecified: Secondary | ICD-10-CM | POA: Insufficient documentation

## 2019-02-23 NOTE — Progress Notes (Signed)
Virtual Visit via Video Note   This visit type was conducted due to national recommendations for restrictions regarding the COVID-19 Pandemic (e.g. social distancing) in an effort to limit this patient's exposure and mitigate transmission in our community.  Due to his co-morbid illnesses, this patient is at least at moderate risk for complications without adequate follow up.  This format is felt to be most appropriate for this patient at this time.  All issues noted in this document were discussed and addressed.  A limited physical exam was performed with this format.  Please refer to the patient's chart for his consent to telehealth for [facility].   I connected with  Keith Davidson. on 02/24/19 by a video enabled telemedicine application and verified that I am speaking with the correct person using two identifiers. I discussed the limitations of evaluation and management by telemedicine. The patient expressed understanding and agreed to proceed.   Evaluation Performed:  Follow-up visit  Date:  02/24/2019   ID:  Keith Davidson., DOB Sep 09, 1946, MRN [id number]  Patient Location:  [address] [place] [address]   Provider location:   [facility], [place] office  PCP:  System, Provider Not In  Cardiologist:  [name]   Chief Complaint: Chest pain, shortness of breath, arm pain  New patient  History of Present Illness:    Keith Jent. is a 73 y.o. male who presents via audio/video conferencing for a telehealth visit today.   The patient does not symptoms concerning for COVID-19 infection (fever, chills, cough, or new SHORTNESS OF BREATH).   Patient has a past medical history of Hypertension Hyperlipidemia Former smoker Mnire's disease Prior water skiing accident DDD, MRI revealing multilevel spondylosis particularly at the C5-C6 without herniation Presenting for new patient evaluation for arm pain, mild shortness of breath, chest  pain, strong family history of coronary disease  At baseline reports a long history of vestibular problems, vertigo evaluated by both ENT and neurology for a recurrent vestibular problem with intermittent vertigo. He was also been seen at [facility] with a full workup including an MRI which was normal. He was given meclizine and some other off label medication he uses as needed.  Recent lab work reviewed Total cholesterol 160 LDL 70 triglycerides 96 In general cholesterol excellent   Parents passed away from cardiac issues Details unclear  He has been waking in the middle of the night with arm pain Tried tums Tried sleeping sitting up Does push mow, arms hurt, some SOB Though reports he is still very active with no reproducible chest pain symptoms  No prior cardiac work-up in the past 10 years No diabetes, non-smoker, cholesterol excellent   Prior CV studies:   The following studies were reviewed today:  >10 yrs ago did a stress test   Past Medical History:  Diagnosis Date  . CAD (coronary artery disease)   . Hypertension   . Skin cancer 2015   face  . Vertigo    Past Surgical History:  Procedure Laterality Date  . ELBOW SURGERY Right   . FOOT SURGERY Right    big toe  . HAND SURGERY Right    thumb and middle finger  . HERNIA REPAIR Left 05/12/2005   Laparoscopic, Proceed mesh, Milus Mallick, M.D.; [place], [place] Left 06/17/2018   Left groin exploration with resection of large lipoma the cord.  Medium PerFix plug Surgeon: Robert Bellow,  MD;  Location: [facility] ORS;  Service: General;  Laterality: Left;  . KNEE SURGERY Right    x2  . SHOULDER SURGERY Left   . SKIN CANCER EXCISION  2015   face     Current Meds  Medication Sig  . amLODipine (NORVASC) 5 MG tablet Take 5 mg by mouth every morning.   . Betahistine HCl (BETAHISTINE DIHYDROCHLORIDE) POWD Take 24 mg by mouth daily. [place] Davidson  . diazepam  (VALIUM) 2 MG tablet Take 2 mg by mouth 2 (two) times daily as needed (for dizziness/vertigo).  . fenofibrate 54 MG tablet Take 54 mg by mouth every morning.   . Methylcellulose, Laxative, (CITRUCEL PO) Take 1 tablet by mouth daily.   . Multiple Vitamin (MULTIVITAMIN WITH MINERALS) TABS tablet Take 1 tablet by mouth daily. Centrum Silver  . naproxen (NAPROSYN) 500 MG tablet Take 500 mg by mouth daily.   . ondansetron (ZOFRAN) 4 MG tablet Take 4 mg by mouth every 8 (eight) hours as needed for nausea or vomiting.  . triamterene-hydrochlorothiazide (MAXZIDE-25) 37.5-25 MG tablet Take 1 tablet by mouth daily.     Allergies:   Celebrex [celecoxib] and Sulfa antibiotics   Social History   Tobacco Use  . Smoking status: Former Smoker    Years: 8.00    Types: Cigarettes    Last attempt to quit: 10/23/1978    Years since quitting: 40.3  . Smokeless tobacco: Never Used  . Tobacco comment: weekends only  Substance Use Topics  . Alcohol use: Yes    Comment: wine 3-4 times week  . Drug use: Never     Current Outpatient Medications on File Prior to Visit  Medication Sig Dispense Refill  . amLODipine (NORVASC) 5 MG tablet Take 5 mg by mouth every morning.   1  . Betahistine HCl (BETAHISTINE DIHYDROCHLORIDE) POWD Take 24 mg by mouth daily. Keith Davidson    . diazepam (VALIUM) 2 MG tablet Take 2 mg by mouth 2 (two) times daily as needed (for dizziness/vertigo).    . fenofibrate 54 MG tablet Take 54 mg by mouth every morning.   3  . Methylcellulose, Laxative, (CITRUCEL PO) Take 1 tablet by mouth daily.     . Multiple Vitamin (MULTIVITAMIN WITH MINERALS) TABS tablet Take 1 tablet by mouth daily. Centrum Silver    . naproxen (NAPROSYN) 500 MG tablet Take 500 mg by mouth daily.   3  . ondansetron (ZOFRAN) 4 MG tablet Take 4 mg by mouth every 8 (eight) hours as needed for nausea or vomiting.    . triamterene-hydrochlorothiazide (MAXZIDE-25) 37.5-25 MG tablet Take 1 tablet by mouth  daily.  11   No current facility-administered medications on file prior to visit.      Family Hx: The patient's family history includes Heart attack in his father and mother; Heart disease in his father and mother.  ROS:   Please see the history of present illness.    Review of Systems  Constitutional: Negative.   Respiratory: Positive for shortness of breath.   Cardiovascular: Positive for chest pain.  Gastrointestinal: Negative.   Musculoskeletal: Negative.        Arm pain  Neurological: Negative.   Psychiatric/Behavioral: Negative.   All other systems reviewed and are negative.     Labs/Other Tests and Data Reviewed:    Recent Labs: 06/11/2018: BUN 21; Creatinine, Ser 1.29; Hemoglobin 15.8; Platelets 276; Potassium 4.1; Sodium 139   Recent Lipid Panel No results found for: CHOL, TRIG,  HDL, CHOLHDL, LDLCALC, LDLDIRECT  Wt Readings from Last 3 Encounters:  07/25/18 208 lb (94.3 kg)  06/25/18 213 lb (96.6 kg)  06/17/18 209 lb (94.8 kg)     Exam:    Vital Signs: Vital signs may also be detailed in the HPI There were no vitals taken for this visit.  Wt Readings from Last 3 Encounters:  07/25/18 208 lb (94.3 kg)  06/25/18 213 lb (96.6 kg)  06/17/18 209 lb (94.8 kg)   Temp Readings from Last 3 Encounters:  06/17/18 (!) 96.7 F (35.9 C) (Temporal)   BP Readings from Last 3 Encounters:  07/25/18 122/76  06/25/18 128/70  06/17/18 125/77   Pulse Readings from Last 3 Encounters:  07/25/18 74  06/25/18 70  06/17/18 65    130s over 70s, pulse 70s, respirations 16  Well nourished, well developed male in no acute distress. Constitutional:  oriented to person, place, and time. No distress.  Head: Normocephalic and atraumatic.  Eyes:  no discharge. No scleral icterus.  Neck: Normal range of motion. Neck supple.  Pulmonary/Chest: No audible wheezing, no distress, appears comfortable Musculoskeletal: Normal range of motion.  no  tenderness or deformity.   Neurological:   Coordination normal. Full exam not performed Skin:  No rash Psychiatric:  normal mood and affect. behavior is normal. Thought content normal.    ASSESSMENT & PLAN:    Chest pain of uncertain etiology Somewhat atypical chest pain Very few risk factors for coronary disease, low cholesterol, no diabetes, non-smoker Discussed risk stratification testing with him in detail After long discussion we have recommended CT coronary calcium scoring If score is low, no further testing needed For markedly elevated score, would potentially proceed with stress testing such as treadmill Myoview  Mixed hyperlipidemia Very low numbers, not really on any medications that would affect these numbers For low calcium score potentially could hold the fenofibrate Given his low triglycerides, likely of little clinical benefit  Meniere's disease, unspecified laterality Managed by [facility], ENT Reports having periodic symptoms once every 6 to 12 months  SOB (shortness of breath) Mild shortness of breath with push mowing The with other activities no symptoms Somewhat atypical in nature, Screening study with calcium scoring as above  Family history of premature coronary artery disease Testing as above  COVID-19 Education: The signs and symptoms of COVID-19 were discussed with the patient and how to seek care for testing (follow up with PCP or arrange E-visit).  The importance of social distancing was discussed today.  Patient Risk:   After full review of this patients clinical status, I feel that they are at least moderate risk at this time.  Time:   Today, I have spent 45 minutes with the patient with telehealth technology discussing the cardiac and medical problems/diagnoses detailed above   10 min spent reviewing the chart prior to patient visit today   Medication Adjustments/Labs and Tests Ordered: Current medicines are reviewed at length with the patient today.  Concerns regarding  medicines are outlined above.   Tests Ordered: No tests ordered   Medication Changes: No changes made   Disposition: Follow-up as needed We will call him with the CT coronary calcium score results   Signed, Ida Rogue, MD  02/24/2019 4:52 PM    [place] [facility] [id number], [place], [place] [address]

## 2019-02-24 ENCOUNTER — Encounter

## 2019-02-24 ENCOUNTER — Other Ambulatory Visit: Payer: Self-pay

## 2019-02-24 ENCOUNTER — Telehealth (INDEPENDENT_AMBULATORY_CARE_PROVIDER_SITE_OTHER): Payer: Medicare Other | Admitting: Cardiovascular Disease

## 2019-02-24 DIAGNOSIS — Z8249 Family history of ischemic heart disease and other diseases of the circulatory system: Secondary | ICD-10-CM

## 2019-02-24 DIAGNOSIS — E782 Mixed hyperlipidemia: Secondary | ICD-10-CM

## 2019-02-24 DIAGNOSIS — R0602 Shortness of breath: Secondary | ICD-10-CM | POA: Diagnosis not present

## 2019-02-24 DIAGNOSIS — R079 Chest pain, unspecified: Secondary | ICD-10-CM

## 2019-02-24 DIAGNOSIS — H8109 Meniere's disease, unspecified ear: Secondary | ICD-10-CM | POA: Diagnosis not present

## 2019-02-24 DIAGNOSIS — R0789 Other chest pain: Secondary | ICD-10-CM

## 2019-02-24 DIAGNOSIS — M79603 Pain in arm, unspecified: Secondary | ICD-10-CM | POA: Insufficient documentation

## 2019-02-24 NOTE — Patient Instructions (Addendum)
Medication Instructions:  No changes  If you need a refill on your cardiac medications before your next appointment, please call your pharmacy.    Lab work: No new labs needed   If you have labs (blood work) drawn today and your tests are completely normal, you will receive your results only by: Marland Kitchen MyChart Message (if you have MyChart) OR . A paper copy in the mail If you have any lab test that is abnormal or we need to change your treatment, we will call you to review the results.   Testing/Procedures:  We will order a CT coronary calcium score Family history of coronary disease $150.00 for test Call [phone] to schedule. They are currently delaying until end of July. Location: [address] Floor) [place]     Follow-Up: At [facility], you and your health needs are our priority.  As part of our continuing mission to provide you with exceptional heart care, we have created designated Provider Care Teams.  These Care Teams include your primary Cardiologist (physician) and Advanced Practice Providers (APPs -  Physician Assistants and Nurse Practitioners) who all work together to provide you with the care you need, when you need it.  . You will need a follow up appointment as needed  . Providers on your designated Care Team:   . Murray Hodgkins, NP . Christell Faith, PA-C . Marrianne Mood, PA-C  Any Other Special Instructions Will Be Listed Below (If Applicable).  For educational health videos Log in to : [email or web address] Or : [email or web address], password : [email or web address]

## 2019-04-14 ENCOUNTER — Telehealth: Payer: Self-pay | Admitting: *Deleted

## 2019-04-14 NOTE — Telephone Encounter (Signed)
Script Screening patients for COVID-19 and reviewing new operational procedures  Greeting - The reason I am calling is to share with you some new changes to our processes that are designed to help us keep everyone safe. Is now a good time to speak with you? Patient says "no' - ask them when you can call back and let them know it's important to do this prior to their appointment.  Patient says "yes" - Great, Keith Davidson the first thing I need to do is ask you some screening Questions.  1. To the best of your knowledge, have you been in close contact with any one with a confirmed diagnosis of COVID 19? o No - proceed to next question  2. Have you had any one or more of the following: fever, chills, cough, shortness of breath or any flu-like symptoms? o No - proceed to next question  3. Have you been diagnosed with or have a previous diagnosis of COVID 19? o No - proceed to next question  4. I am going to go over a few other symptoms with you. Please let me know if you are experiencing any of the following: . Ear, nose or throat discomfort . A sore throat . Headache . Muscle pain . Diarrhea . Loss of taste or smell o No - proceed to next question  Thank you for answering these questions. Please know we will ask you these questions or similar questions when you arrive for your appointment and again it's how we are keeping everyone safe. Also, to keep you safe, please use the provided hand sanitizer when you enter the building. Keith Davidson, we are asking everyone in the building to wear a mask because they help us prevent the spread of germs. Do you have a mask of your own, if not, we are happy to provide one for you. The last thing I want to go over with you is the no visitor guidelines. This means no one can attend the appointment with you unless you need physical assistance. I understand this may be different from your past appointments and I know this may be difficult but please know if someone  is driving you we are happy to call them for you once your appointment is over.  [INSERT SITE SPECIFIC CHECK IN PROCEDURES]  Keith Davidson I've given you a lot of information, what questions do you have about what I've talked about today or your appointment tomorrow? 

## 2019-04-15 ENCOUNTER — Ambulatory Visit (INDEPENDENT_AMBULATORY_CARE_PROVIDER_SITE_OTHER)
Admission: RE | Admit: 2019-04-15 | Discharge: 2019-04-15 | Disposition: A | Discharge status: Home / Self Care | Payer: Self-pay | Source: Ambulatory Visit | Attending: Cardiovascular Disease | Admitting: Cardiovascular Disease

## 2019-04-15 ENCOUNTER — Telehealth: Payer: Self-pay

## 2019-04-15 ENCOUNTER — Other Ambulatory Visit: Payer: Self-pay

## 2019-04-15 DIAGNOSIS — Z8249 Family history of ischemic heart disease and other diseases of the circulatory system: Secondary | ICD-10-CM

## 2019-04-15 MED ORDER — ROSUVASTATIN CALCIUM 10 MG PO TABS: 10.0000 mg | ORAL_TABLET | Freq: Every day | ORAL | 6 refills | Status: DC

## 2019-04-15 NOTE — Telephone Encounter (Signed)
Call to patient to discuss Ca score results.   I have placed order for Crestor as requested by Dr. Rockey Situ.   Pt verbalized understanding and cooperative with POC. He denies any chest pain or other discomfort at this time.   Advised pt to call for any further questions or concerns.  F/u as previously stated on AVS.

## 2019-04-15 NOTE — Telephone Encounter (Signed)
-----   Message from Minna Merritts, MD sent at 04/15/2019 12:01 PM EDT ----- CT coronary calcium score Markedly elevated coronary calcification/atherosclerosis noted Compared to other people his age 73 percentile This was unexpected given his risk profile Need to treat aggressively, would start Crestor 10 mg daily to achieve very low LDL and minimize progression of atherosclerosis Would also consider stress testing if he has further chest pain and arm not from muscle or ligamental strain (from mowing, etc)

## 2019-05-24 DIAGNOSIS — J189 Pneumonia, unspecified organism: Secondary | ICD-10-CM

## 2019-05-24 HISTORY — DX: Pneumonia, unspecified organism: J18.9

## 2019-05-26 ENCOUNTER — Other Ambulatory Visit: Payer: Self-pay

## 2019-05-26 DIAGNOSIS — Z20822 Contact with and (suspected) exposure to covid-19: Secondary | ICD-10-CM

## 2019-05-27 LAB — NOVEL CORONAVIRUS, NAA: SARS-CoV-2, NAA: NOT DETECTED

## 2019-08-12 ENCOUNTER — Other Ambulatory Visit: Payer: Self-pay

## 2019-08-12 ENCOUNTER — Other Ambulatory Visit
Admission: RE | Admit: 2019-08-12 | Discharge: 2019-08-12 | Disposition: A | Discharge status: Home / Self Care | Payer: Medicare Other | Source: Ambulatory Visit | Attending: Ophthalmology | Admitting: Ophthalmology

## 2019-08-12 DIAGNOSIS — Z01812 Encounter for preprocedural laboratory examination: Secondary | ICD-10-CM | POA: Insufficient documentation

## 2019-08-12 DIAGNOSIS — Z20828 Contact with and (suspected) exposure to other viral communicable diseases: Secondary | ICD-10-CM | POA: Insufficient documentation

## 2019-08-12 LAB — SARS CORONAVIRUS 2 (TAT 6-24 HRS): SARS Coronavirus 2: NEGATIVE

## 2019-08-14 ENCOUNTER — Encounter: Payer: Self-pay | Admitting: Anesthesiology

## 2019-08-15 ENCOUNTER — Ambulatory Visit: Payer: Medicare Other | Admitting: Anesthesiology

## 2019-08-15 ENCOUNTER — Encounter: Payer: Self-pay | Admitting: *Deleted

## 2019-08-15 ENCOUNTER — Other Ambulatory Visit: Payer: Self-pay

## 2019-08-15 ENCOUNTER — Encounter
Admission: RE | Disposition: A | Discharge status: Home / Self Care | Payer: Self-pay | Source: Home / Self Care | Attending: Ophthalmology

## 2019-08-15 ENCOUNTER — Ambulatory Visit
Admission: RE | Admit: 2019-08-15 | Discharge: 2019-08-15 | Disposition: A | Discharge status: Home / Self Care | Payer: Medicare Other | Attending: Ophthalmology | Admitting: Ophthalmology

## 2019-08-15 DIAGNOSIS — I10 Essential (primary) hypertension: Secondary | ICD-10-CM | POA: Diagnosis not present

## 2019-08-15 DIAGNOSIS — Z882 Allergy status to sulfonamides status: Secondary | ICD-10-CM | POA: Diagnosis not present

## 2019-08-15 DIAGNOSIS — K219 Gastro-esophageal reflux disease without esophagitis: Secondary | ICD-10-CM | POA: Insufficient documentation

## 2019-08-15 DIAGNOSIS — Z87891 Personal history of nicotine dependence: Secondary | ICD-10-CM | POA: Insufficient documentation

## 2019-08-15 DIAGNOSIS — H2511 Age-related nuclear cataract, right eye: Secondary | ICD-10-CM | POA: Diagnosis present

## 2019-08-15 DIAGNOSIS — H11221 Conjunctival granuloma, right eye: Secondary | ICD-10-CM | POA: Insufficient documentation

## 2019-08-15 DIAGNOSIS — Z79899 Other long term (current) drug therapy: Secondary | ICD-10-CM | POA: Diagnosis not present

## 2019-08-15 DIAGNOSIS — M1990 Unspecified osteoarthritis, unspecified site: Secondary | ICD-10-CM | POA: Insufficient documentation

## 2019-08-15 HISTORY — DX: Unspecified osteoarthritis, unspecified site: M19.90

## 2019-08-15 HISTORY — DX: Gastro-esophageal reflux disease without esophagitis: K21.9

## 2019-08-15 HISTORY — DX: Hypercalcemia: E83.52

## 2019-08-15 HISTORY — DX: Diverticulosis of intestine, part unspecified, without perforation or abscess without bleeding: K57.90

## 2019-08-15 HISTORY — DX: Unspecified hearing loss, unspecified ear: H91.90

## 2019-08-15 HISTORY — PX: RECONSTRUCTION OF EYELID: SHX6576

## 2019-08-15 SURGERY — RECONSTRUCTION, EYELID
Anesthesia: Monitor Anesthesia Care | Site: Eye | Laterality: Right

## 2019-08-15 MED ORDER — SODIUM CHLORIDE 0.9 % IV SOLN
INTRAVENOUS | Status: DC
  Administered 2019-08-15 (×2): via INTRAVENOUS

## 2019-08-15 MED ORDER — PROPOFOL 10 MG/ML IV BOLUS
INTRAVENOUS | Status: AC
  Filled 2019-08-15: qty 20

## 2019-08-15 MED ORDER — MOXIFLOXACIN HCL 0.5 % OP SOLN
OPHTHALMIC | Status: AC
  Filled 2019-08-15: qty 3

## 2019-08-15 MED ORDER — PREDNISOLONE ACETATE 1 % OP SUSP
OPHTHALMIC | Status: DC | PRN
  Administered 2019-08-15: 2 [drp] via OPHTHALMIC

## 2019-08-15 MED ORDER — NEOMYCIN-POLYMYXIN-DEXAMETH 3.5-10000-0.1 OP OINT
TOPICAL_OINTMENT | Freq: Once | OPHTHALMIC | Status: DC
  Filled 2019-08-15: qty 3.5

## 2019-08-15 MED ORDER — FENTANYL CITRATE (PF) 100 MCG/2ML IJ SOLN
INTRAMUSCULAR | Status: AC
  Filled 2019-08-15: qty 2

## 2019-08-15 MED ORDER — MIDAZOLAM HCL 2 MG/2ML IJ SOLN
INTRAMUSCULAR | Status: DC | PRN
  Administered 2019-08-15 (×2): 1 mg via INTRAVENOUS
  Administered 2019-08-15: 2 mg via INTRAVENOUS

## 2019-08-15 MED ORDER — PREDNISOLONE ACETATE 1 % OP SUSP
1.0000 [drp] | OPHTHALMIC | Status: DC
  Filled 2019-08-15: qty 1

## 2019-08-15 MED ORDER — ONDANSETRON HCL 4 MG/2ML IJ SOLN
INTRAMUSCULAR | Status: DC | PRN
  Administered 2019-08-15: 4 mg via INTRAVENOUS

## 2019-08-15 MED ORDER — ONDANSETRON HCL 4 MG/2ML IJ SOLN
INTRAMUSCULAR | Status: AC
  Filled 2019-08-15: qty 4

## 2019-08-15 MED ORDER — POVIDONE-IODINE 5 % EX SOLN
CUTANEOUS | Status: DC | PRN
  Administered 2019-08-15: 1 via TOPICAL

## 2019-08-15 MED ORDER — MIDAZOLAM HCL 2 MG/2ML IJ SOLN
INTRAMUSCULAR | Status: AC
  Filled 2019-08-15: qty 2

## 2019-08-15 SURGICAL SUPPLY — 28 items
AMNIOGRAFT 2.5X2.0 (Orthopedic Implant) ×1 IMPLANT
APPLICATOR COTTON TIP 6 STRL (MISCELLANEOUS) ×1 IMPLANT
APPLICATOR COTTON TIP 6IN STRL (MISCELLANEOUS) ×2
APPLICATOR DR MATTHEWS STRL (MISCELLANEOUS) ×2 IMPLANT
BLADE SURG MINI STRL (BLADE) ×2 IMPLANT
BNDG EYE OVAL (GAUZE/BANDAGES/DRESSINGS) ×4 IMPLANT
CORD BIP STRL DISP 12FT (MISCELLANEOUS) ×1 IMPLANT
CORNEAL LIGHT SHIELD (MISCELLANEOUS) ×4
ERASER HMR WETFIELD 18G (MISCELLANEOUS) ×3 IMPLANT
FORCEPS JEWEL BIP 4-3/4 STR (INSTRUMENTS) ×1 IMPLANT
GLOVE BIO SURGEON STRL SZ8 (GLOVE) ×2 IMPLANT
GLOVE BIOGEL M 6.5 STRL (GLOVE) ×2 IMPLANT
GLOVE SURG LX 8.0 MICRO (GLOVE) ×1
GLOVE SURG LX STRL 8.0 MICRO (GLOVE) ×1 IMPLANT
GOWN STRL REUS W/ TWL LRG LVL3 (GOWN DISPOSABLE) ×2 IMPLANT
GOWN STRL REUS W/TWL LRG LVL3 (GOWN DISPOSABLE) ×2
MARKER SKIN DUAL TIP RULER LAB (MISCELLANEOUS) ×2 IMPLANT
NDL HYPO 30X.5 LL (NEEDLE) ×1 IMPLANT
NDL SAFETY ECLIPSE 18X1.5 (NEEDLE) ×1 IMPLANT
NEEDLE HYPO 18GX1.5 SHARP (NEEDLE) ×1
NEEDLE HYPO 30X.5 LL (NEEDLE) ×2 IMPLANT
PACK CATARACT (MISCELLANEOUS) ×2 IMPLANT
SHIELD LIGHT CORNEAL (MISCELLANEOUS) ×1 IMPLANT
SUT VICRYL 7 0 TG140 8 (SUTURE) ×2 IMPLANT
SYR 5ML LL (SYRINGE) ×2 IMPLANT
TISSEEL 4ML HEMOSTATIC FIBRIN (Miscellaneous) ×1 IMPLANT
WATER STERILE IRR 1000ML POUR (IV SOLUTION) ×2 IMPLANT
WIPE NON LINTING 3.25X3.25 (MISCELLANEOUS) ×3 IMPLANT

## 2019-08-15 NOTE — H&P (Signed)
All labs reviewed. Abnormal studies sent to patients PCP when indicated.  Previous H&P reviewed, patient examined, there are NO CHANGES.  Keith Drone Porfilio10/23/20208:35 AM

## 2019-08-15 NOTE — Anesthesia Postprocedure Evaluation (Signed)
Anesthesia Post Note  Patient: Keith Davidson.  Procedure(s) Performed: OCULAR SURFACE RECONSTRUCTION, AMNIOTIC MEMBRANCE TRANSPLANTATION (Right Eye)  Patient location during evaluation: Phase II Anesthesia Type: MAC Level of consciousness: awake, oriented, awake and alert and patient cooperative Pain management: pain level controlled Vital Signs Assessment: post-procedure vital signs reviewed and stable Respiratory status: spontaneous breathing Cardiovascular status: stable Postop Assessment: no apparent nausea or vomiting and able to ambulate Anesthetic complications: no     Last Vitals:  Vitals:   08/15/19 0711 08/15/19 0929  BP: 133/88 124/76  Pulse: 87 72  Resp: 16 16  Temp: 36.6 C 36.5 C  SpO2: 100% 96%    Last Pain:  Vitals:   08/15/19 0929  TempSrc: Temporal  PainSc: 0-No pain                 Demtrius Rounds [place]

## 2019-08-15 NOTE — Discharge Instructions (Addendum)
AMBULATORY SURGERY  DISCHARGE INSTRUCTIONS   1) The drugs that you were given will stay in your system until tomorrow so for the next 24 hours you should not:  A) Drive an automobile B) Make any legal decisions C) Drink any alcoholic beverage   2) You may resume regular meals tomorrow.  Today it is better to start with liquids and gradually work up to solid foods.  You may eat anything you prefer, but it is better to start with liquids, then soup and crackers, and gradually work up to solid foods.   3) Please notify your doctor immediately if you have any unusual bleeding, trouble breathing, redness and pain at the surgery site, drainage, fever, or pain not relieved by medication.    4) Additional Instructions:   Wear solar shield eyeglasses.  Do not do any eye drops, you will be instructed re same at MD visit.  Take blue pouch and discharge instructions with you to appt this afternoon.        Please contact your physician with any problems or Same Day Surgery at [phone], Monday through Friday 6 am to 4 pm, or [place] at [facility] number at [phone].

## 2019-08-15 NOTE — Anesthesia Preprocedure Evaluation (Signed)
Anesthesia Evaluation  Patient identified by MRN, date of birth, ID band Patient awake    Reviewed: Allergy & Precautions, NPO status , Patient's Chart, lab work & pertinent test results, reviewed documented beta blocker date and time   Airway Mallampati: III  TM Distance: >3 FB     Dental  (+) Chipped   Pulmonary shortness of breath, pneumonia, resolved, former smoker,           Cardiovascular hypertension, + CAD       Neuro/Psych    GI/Hepatic GERD  Controlled,  Endo/Other    Renal/GU      Musculoskeletal  (+) Arthritis ,   Abdominal   Peds  Hematology   Anesthesia Other Findings   Reproductive/Obstetrics                             Anesthesia Physical Anesthesia Plan  ASA: III  Anesthesia Plan: MAC   Post-op Pain Management:    Induction: Intravenous  PONV Risk Score and Plan:   Airway Management Planned:   Additional Equipment:   Intra-op Plan:   Post-operative Plan:   Informed Consent: I have reviewed the patients History and Physical, chart, labs and discussed the procedure including the risks, benefits and alternatives for the proposed anesthesia with the patient or authorized representative who has indicated his/her understanding and acceptance.       Plan Discussed with: CRNA  Anesthesia Plan Comments:         Anesthesia Quick Evaluation

## 2019-08-15 NOTE — Transfer of Care (Signed)
Immediate Anesthesia Transfer of Care Note  Patient: Keith Davidson.  Procedure(s) Performed: OCULAR SURFACE RECONSTRUCTION, AMNIOTIC MEMBRANCE TRANSPLANTATION (Right Eye)  Patient Location: PACU  Anesthesia Type:MAC  Level of Consciousness: awake, alert , oriented and patient cooperative  Airway & Oxygen Therapy: Patient Spontanous Breathing  Post-op Assessment: Report given to RN and Post -op Vital signs reviewed and stable  Post vital signs: Reviewed and stable  Last Vitals:  Vitals Value Taken Time  BP 124/76 08/15/19 0929  Temp 36.5 C 08/15/19 0929  Pulse 72 08/15/19 0929  Resp 16 08/15/19 0929  SpO2 96 % 08/15/19 0929    Last Pain:  Vitals:   08/15/19 0929  TempSrc: Temporal  PainSc: 0-No pain         Complications: No apparent anesthesia complications

## 2019-08-15 NOTE — Anesthesia Post-op Follow-up Note (Signed)
Anesthesia QCDR form completed.        

## 2019-08-15 NOTE — Op Note (Signed)
PREOPERATIVE DIAGNOSIS:  Nuclear sclerotic cataract of the right eye.   POSTOPERATIVE DIAGNOSIS:  Conjunctival Granuloma   OPERATIVE PROCEDURE: Procedure(s): OCULAR SURFACE RECONSTRUCTION, AMNIOTIC MEMBRANCE TRANSPLANTATION   SURGEON:  Birder Robson, MD.   ANESTHESIA:  Anesthesiologist: Gunnar Bulla, MD CRNA: Kelton Pillar, CRNA  1.      Managed anesthesia care. 2.      0.5 cc 2% Lidocaine with epi placed subconj.   COMPLICATIONS:  None.   TECHNIQUE:   Stop and chop   DESCRIPTION OF PROCEDURE:  The patient was examined and consented in the preoperative holding area where the aforementioned topical anesthesia was applied to the right eye and then brought back to the Operating Room. The lidocaine was infiltrated into the conj.  the right eye was prepped and draped in the usual sterile ophthalmic fashion and a lid speculum was placed.      Sharp dissection with Westcott scissors was employed to resect the granuloma. A small amount of fibrovascular tissue remained adherent to the sclera. Hemostasis was achieved with wet field cautery. The bed was inspected and dried. The Thrombin was placed on the sclera followed by a few drops of the Fibrinogen. The Amniograft tissue was placed stromal side down and the edges were tucked under the free edges of the conjunctiva. A 14.2 mm contact lens was placed to secure the limbal edge of the graft. Vigamox and Prednisolone were placed on the eye. The patient was given protective glasses to wear throughout the day and a shield with which to sleep tonight. The patient was also given drops with which to begin a drop regimen today and will follow-up with me in one day. Implant Name Type Inv. Item Serial No. Manufacturer Lot No. LRB No. Used Action  TISSEEL 4ML HEMOSTATIC FIBRIN - [id number] Miscellaneous TISSEEL 4ML HEMOSTATIC FIBRIN [id number] [employer]  Right 1 Implanted  AMNIOGRAFT 2.5X2.0 - [id number] Orthopedic Implant  AMNIOGRAFT 2.5X2.0 [id number] BIOT[employer] [id number] Right 1 Implanted   Procedure(s): OCULAR SURFACE RECONSTRUCTION, AMNIOTIC MEMBRANCE TRANSPLANTATION (Right)  Electronically signed: Birder Robson 08/15/2019 9:30 AM

## 2019-08-18 ENCOUNTER — Encounter: Payer: Self-pay | Admitting: Ophthalmology

## 2019-11-11 ENCOUNTER — Other Ambulatory Visit: Payer: Self-pay | Admitting: *Deleted

## 2019-11-11 MED ORDER — ROSUVASTATIN CALCIUM 10 MG PO TABS: 10.0000 mg | ORAL_TABLET | Freq: Every day | ORAL | 1 refills | Status: DC

## 2019-12-14 ENCOUNTER — Ambulatory Visit: Payer: Medicare Other

## 2019-12-21 ENCOUNTER — Ambulatory Visit: Payer: Medicare Other | Attending: Internal Medicine

## 2019-12-21 DIAGNOSIS — Z23 Encounter for immunization: Secondary | ICD-10-CM | POA: Insufficient documentation

## 2019-12-21 NOTE — Progress Notes (Signed)
   [id number]  Name:  Keith Davidson.    MRN: [id number] DOB: [birth year]  12/21/2019  Keith Davidson was observed post Covid-19 immunization for 15 minutes without incidence. He was provided with Vaccine Information Sheet and instruction to access the V-Safe system.   Keith Davidson was instructed to call 911 with any severe reactions post vaccine: Marland Kitchen Difficulty breathing  . Swelling of your face and throat  . A fast heartbeat  . A bad rash all over your body  . Dizziness and weakness    Immunizations Administered    Name Date Dose VIS Date Route   Pfizer COVID-19 Vaccine 12/21/2019 10:25 AM 0.3 mL 10/03/2019 Intramuscular   Manufacturer: [place]   Lot: [id number]   [place]: [id number]

## 2020-01-12 ENCOUNTER — Ambulatory Visit: Payer: Medicare Other | Attending: Internal Medicine

## 2020-01-12 DIAGNOSIS — Z23 Encounter for immunization: Secondary | ICD-10-CM

## 2020-01-12 NOTE — Progress Notes (Signed)
   [id number] [facility]  Name:  Maaz Magruder.    MRN: [id number] DOB: [birth year]  01/12/2020  Mr. Reich was observed post Covid-19 immunization for 15 minutes without incident. He was provided with Vaccine Information Sheet and instruction to access the V-Safe system.   Mr. Steeb was instructed to call 911 with any severe reactions post vaccine: Marland Kitchen Difficulty breathing  . Swelling of face and throat  . A fast heartbeat  . A bad rash all over body  . Dizziness and weakness   Immunizations Administered    Name Date Dose VIS Date Route   Pfizer COVID-19 Vaccine 01/12/2020  9:34 AM 0.3 mL 10/03/2019 Intramuscular   Manufacturer: [place]   Lot: [id number]   [place]: [id number]

## 2020-05-21 ENCOUNTER — Other Ambulatory Visit: Payer: Self-pay | Admitting: Cardiovascular Disease

## 2020-05-21 NOTE — Telephone Encounter (Signed)
Scheduled

## 2020-05-21 NOTE — Telephone Encounter (Signed)
Pt overdue for 12 month f/u pt needing refills.  Please contact pt for future appointment. Pt last seen 02/2019.

## 2020-05-24 ENCOUNTER — Other Ambulatory Visit: Payer: Self-pay | Admitting: Cardiovascular Disease

## 2020-05-24 ENCOUNTER — Telehealth: Payer: Self-pay | Admitting: Cardiovascular Disease

## 2020-05-24 NOTE — Telephone Encounter (Signed)
*  STAT* If patient is at the pharmacy, call can be transferred to refill team.   1. Which medications need to be refilled? (please list name of each medication and dose if known)    Rosuvastatin 10 mg po qhs  2. Which pharmacy/location (including street and city if local pharmacy) is medication to be sent to?  [facility] [address]   3. Do they need a 30 day or 90 day supply? [place]

## 2020-05-24 NOTE — Telephone Encounter (Signed)
Rx sent to pharmacy as requested.

## 2020-05-25 NOTE — Telephone Encounter (Signed)
Spoke with Myriam Jacobson at [place] who states they do have the prescription and it is ready to be picked up. Patient informed.

## 2020-05-25 NOTE — Telephone Encounter (Signed)
Patient calling to check on status  States [facility] says they have not received it  Please resend if needed - patient will check back with pharmacy a little later this afternoon

## 2020-09-23 NOTE — Progress Notes (Signed)
Date:  09/27/2020   ID:  Keith Davidson., DOB [birth year], MRN [id number]  Patient Location:  [address] [place] [address]   Provider location:   [facility], [place] office  PCP:  System, Provider Not In  Cardiologist:  Keith Davidson  Chief Complaint  Patient presents with  . Other    12 month follow up. Meds reviewed verbally with patient.      History of Present Illness:    Keith Davidson. is a 73 y.o. male past medical history of Hypertension Hyperlipidemia Former smoker Mnire's disease Prior water skiing accident DDD, MRI revealing multilevel spondylosis particularly at the C5-C6 without herniation CT calcium 03/2019: Coronary calcium score of 999. This was 63 percentile Presenting for follow-up of his  arm pain, mild shortness of breath, chest pain, strong family history of coronary disease  Last seen by telemetry visit May 2020 Reports feeling well, denies any anginal symptoms  No recent vertigo symptoms  Prior CT coronary calcium reviewed, images pulled up Three-vessel coronary calcification noted Minimal in the aorta  Tolerating Crestor 10 mg daily Some point discomfort medial aspect right leg below the knee, seems to have improved with co-Q10  Lab work from primary care has been seen through the care everywhere system Total cholesterol 140, LDL 48 Numbers from 2019 LDL 70 total cholesterol 160  EKG personally reviewed by myself on todays visit Shows normal sinus rhythm rate 69 bpm no significant ST-T wave changes  Other past medical history reviewed At baseline reports a long history of vestibular problems, vertigo evaluated by both ENT and neurology for a recurrent vestibular problem with intermittent vertigo. He was also been seen at [facility] with a full workup including an MRI which was normal. He was given meclizine and some other off label medication he uses as needed.  Parents passed away from cardiac  issues  No diabetes, non-smoker   Prior CV studies:   The following studies were reviewed today:  >10 yrs ago did a stress test   Past Medical History:  Diagnosis Date  . Arthritis   . CAD (coronary artery disease)   . Diverticulosis   . GERD (gastroesophageal reflux disease)   . History of shingles 2000   R SHOULDER  . HOH (hard of hearing)   . Hypercalcemia   . Hypertension   . Pneumonia 05/2019  . Skin cancer 2015   face  . Vertigo    Past Surgical History:  Procedure Laterality Date  . ELBOW SURGERY Right   . FOOT SURGERY Right    big toe  . HAND SURGERY Right    thumb and middle finger  . HERNIA REPAIR Left 05/12/2005   Laparoscopic, Proceed mesh, Keith Davidson, M.D.; [place], [place] Left 06/17/2018   Left groin exploration with resection of large lipoma the cord.  Medium PerFix plug Surgeon: Keith Bellow, MD;  Location: [facility] ORS;  Service: General;  Laterality: Left;  . KNEE SURGERY Right    x2  . RECONSTRUCTION OF EYELID Right 08/15/2019   Procedure: OCULAR SURFACE RECONSTRUCTION, AMNIOTIC MEMBRANCE TRANSPLANTATION;  Surgeon: Keith Robson, MD;  Location: [facility] ORS;  Service: Ophthalmology;  Laterality: Right;  . SHOULDER SURGERY Left   . SKIN CANCER EXCISION  2015   face     Current Meds  Medication Sig  . amLODipine (NORVASC) 5 MG tablet Take 5 mg by mouth every morning.   Marland Kitchen  Betahistine HCl (BETAHISTINE DIHYDROCHLORIDE) POWD Take 24 mg by mouth daily. [place] Compounds  . diazepam (VALIUM) 2 MG tablet Take 2 mg by mouth 2 (two) times daily as needed (for dizziness/vertigo).  Marland Kitchen esomeprazole (NEXIUM) 40 MG capsule Take 40 mg by mouth daily at 6 (six) AM.  . Methylcellulose, Laxative, (CITRUCEL PO) Take 1 tablet by mouth daily.   . Multiple Vitamin (MULTIVITAMIN WITH MINERALS) TABS tablet Take 1 tablet by mouth daily. Centrum Silver  . naproxen (NAPROSYN) 500 MG tablet Take 500 mg by mouth  daily.   . ondansetron (ZOFRAN) 4 MG tablet Take 4 mg by mouth every 8 (eight) hours as needed for nausea or vomiting.  . rosuvastatin (CRESTOR) 10 MG tablet Take 1 tablet by mouth once daily  . triamterene-hydrochlorothiazide (MAXZIDE-25) 37.5-25 MG tablet Take 1 tablet by mouth daily.     Allergies:   Celebrex [celecoxib] and Sulfa antibiotics   Social History   Tobacco Use  . Smoking status: Former Smoker    Years: 8.00    Types: Cigarettes    Quit date: 10/23/1978    Years since quitting: 41.9  . Smokeless tobacco: Never Used  . Tobacco comment: weekends only  Vaping Use  . Vaping Use: Never used  Substance Use Topics  . Alcohol use: Yes    Comment: wine 3-4 times week  . Drug use: Never     Family Hx: The patient's family history includes Heart attack in his father and mother; Heart disease in his father and mother.  ROS:   Please see the history of present illness.    Review of Systems  Constitutional: Negative.   HENT: Negative.   Respiratory: Negative.   Cardiovascular: Negative.   Gastrointestinal: Negative.   Musculoskeletal: Negative.        Arm pain  Neurological: Negative.   Psychiatric/Behavioral: Negative.   All other systems reviewed and are negative.    Labs/Other Tests and Data Reviewed:    Recent Labs: No results found for requested labs within last 8760 hours.   Recent Lipid Panel No results found for: CHOL, TRIG, HDL, CHOLHDL, LDLCALC, LDLDIRECT  Wt Readings from Last 3 Encounters:  09/27/20 216 lb (98 kg)  08/15/19 211 lb 10.3 oz (96 kg)  07/25/18 208 lb (94.3 kg)     Exam:    Vital Signs: Vital signs may also be detailed in the HPI BP 140/80 (BP Location: Left Arm, Patient Position: Sitting, Cuff Size: Normal)   Pulse 69   Ht 5\' 11"  (1.803 m)   Wt 216 lb (98 kg)   SpO2 93%   BMI 30.13 kg/m    Well nourished, well developed male in no acute distress. Constitutional:  oriented to person, place, and time. No distress.  Head:  Normocephalic and atraumatic.  Eyes:  no discharge. No scleral icterus.  Neck: Normal range of motion. Neck supple.  Pulmonary/Chest: No audible wheezing, no distress, appears comfortable Musculoskeletal: Normal range of motion.  no  tenderness or deformity.  Neurological:   Coordination normal. Full exam not performed Skin:  No rash Psychiatric:  normal mood and affect. behavior is normal. Thought content normal.   ASSESSMENT & PLAN:    Chest pain of uncertain etiology No further chest pain on today's visit As below, discussed anginal symptoms to watch for After discussion, will treat cholesterol very aggressively to limit any progression of underlying coronary calcification  Mixed hyperlipidemia Markedly elevated calcium scoring, around 1000 92nd percentile Recommend continue aggressive lipid management,  Continue Crestor 10 , add Zetia 10 For any further myalgias may need to cut back on the Crestor to 5  Meniere's disease, unspecified laterality Managed by [facility], ENT Rare symptoms  SOB (shortness of breath) Discussed anginal symptoms to watch for, For any further shortness of breath chest pain or arm pain, may need stress testing  Family history of premature coronary artery disease Testing as above   Total encounter time more than 25 minutes  Greater than 50% was spent in counseling and coordination of care with the patient   Signed, Ida Rogue, MD  09/27/2020 12:56 PM    [place] Office [address], [place], [place] [address]

## 2020-09-27 ENCOUNTER — Ambulatory Visit: Payer: Medicare Other | Admitting: Cardiovascular Disease

## 2020-09-27 ENCOUNTER — Other Ambulatory Visit: Payer: Self-pay

## 2020-09-27 ENCOUNTER — Encounter: Payer: Self-pay | Admitting: Cardiovascular Disease

## 2020-09-27 VITALS — BP 140/80 | HR 69 | Ht 71.0 in | Wt 216.0 lb

## 2020-09-27 DIAGNOSIS — R079 Chest pain, unspecified: Secondary | ICD-10-CM | POA: Diagnosis not present

## 2020-09-27 DIAGNOSIS — I25118 Atherosclerotic heart disease of native coronary artery with other forms of angina pectoris: Secondary | ICD-10-CM | POA: Diagnosis not present

## 2020-09-27 DIAGNOSIS — E782 Mixed hyperlipidemia: Secondary | ICD-10-CM | POA: Diagnosis not present

## 2020-09-27 DIAGNOSIS — R0602 Shortness of breath: Secondary | ICD-10-CM | POA: Diagnosis not present

## 2020-09-27 DIAGNOSIS — H8109 Meniere's disease, unspecified ear: Secondary | ICD-10-CM

## 2020-09-27 MED ORDER — EZETIMIBE 10 MG PO TABS: 10.0000 mg | ORAL_TABLET | Freq: Every day | ORAL | 3 refills | Status: DC

## 2020-09-27 NOTE — Patient Instructions (Addendum)
  Medication Instructions:  Start zetia 10 mg daily for cholesterol  If you need a refill on your cardiac medications before your next appointment, please call your pharmacy.    Lab work: No new labs needed   If you have labs (blood work) drawn today and your tests are completely normal, you will receive your results only by: Marland Kitchen MyChart Message (if you have MyChart) OR . A paper copy in the mail If you have any lab test that is abnormal or we need to change your treatment, we will call you to review the results.   Testing/Procedures: No new testing needed   Follow-Up: At [facility], you and your health needs are our priority.  As part of our continuing mission to provide you with exceptional heart care, we have created designated Provider Care Teams.  These Care Teams include your primary Cardiologist (physician) and Advanced Practice Providers (APPs -  Physician Assistants and Nurse Practitioners) who all work together to provide you with the care you need, when you need it.  . You will need a follow up appointment in 12 months  . Providers on your designated Care Team:   . Murray Hodgkins, NP . Christell Faith, PA-C . Marrianne Mood, PA-C  Any Other Special Instructions Will Be Listed Below (If Applicable).  COVID-19 Vaccine Information can be found at: [email or web address] For questions related to vaccine distribution or appointments, please email [email or web address] or call [phone].

## 2020-09-27 NOTE — Addendum Note (Signed)
Addended by: Minna Merritts on: 09/27/2020 01:05 PM   Modules accepted: Level of Service

## 2020-11-07 IMAGING — CT CT HEART SCORING
2 series · 16 of 20 positions shown, 18 images · non-contrast
Comparison: None.
COMPARISON: None.

Addendum:
EXAM:
OVER-READ INTERPRETATION  CT CHEST

The following report is an over-read performed by radiologist Dr.
over-read does not include interpretation of cardiac or coronary
anatomy or pathology. The coronary calcium score interpretation by
the cardiologist is attached.
CLINICAL DATA: Risk stratification
Coronary Calcium Score
TECHNIQUE: The patient was scanned on a Siemens Force scanner. Axial
non-contrast 3 mm slices were carried out through the heart. The
data set was analyzed on a dedicated work station and scored using
the Agatson method.

[Series 2: casc 3.0 i36f 2 bestdiast 71 % · axial · 0.38mm/px · z∈[-214,-130]mm · 8 of 38 slices shown, 10 images]
[im 5/38  vessel]
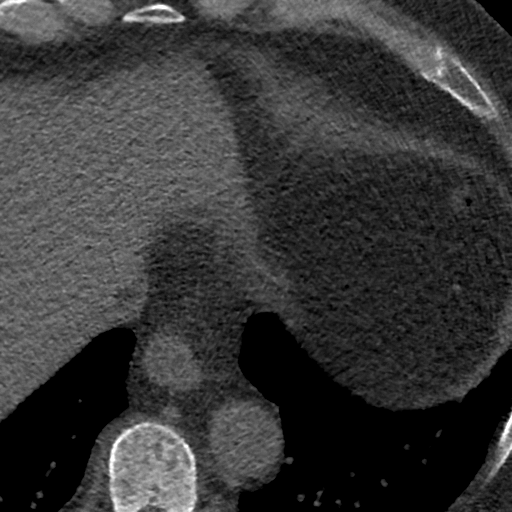
[im 5/38  lung]
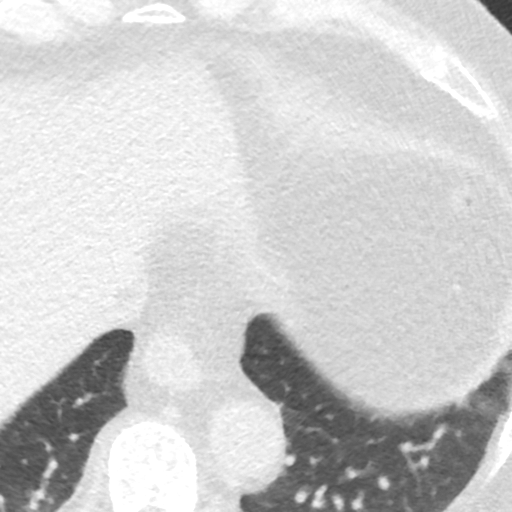
[im 9/38  vessel]
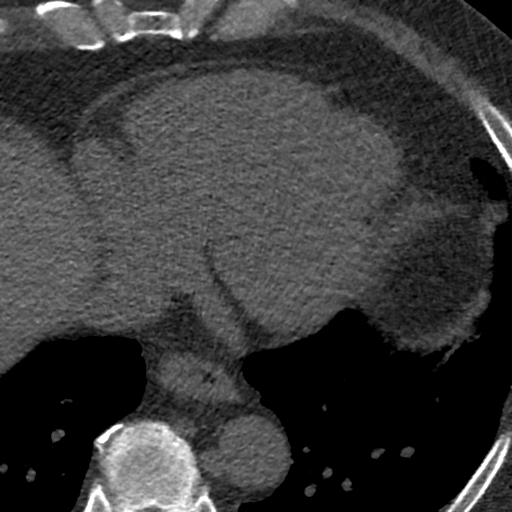
[im 13/38  vessel]
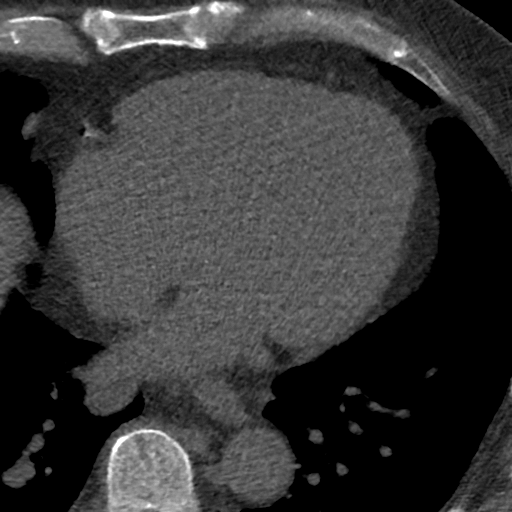
[im 17/38  vessel]
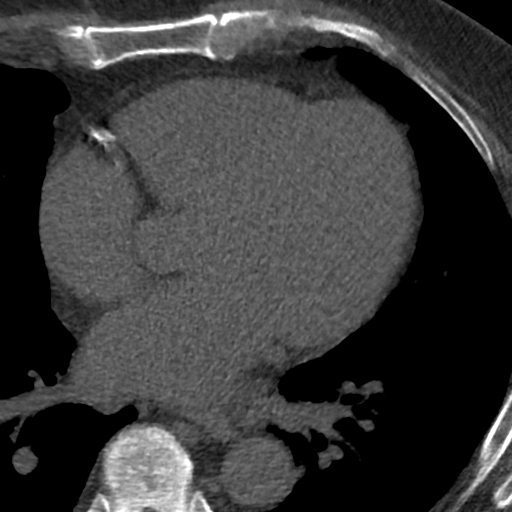
[im 21/38  vessel]
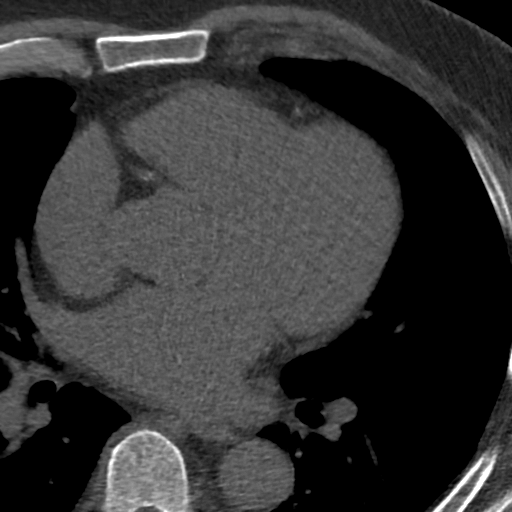
[im 21/38  lung]
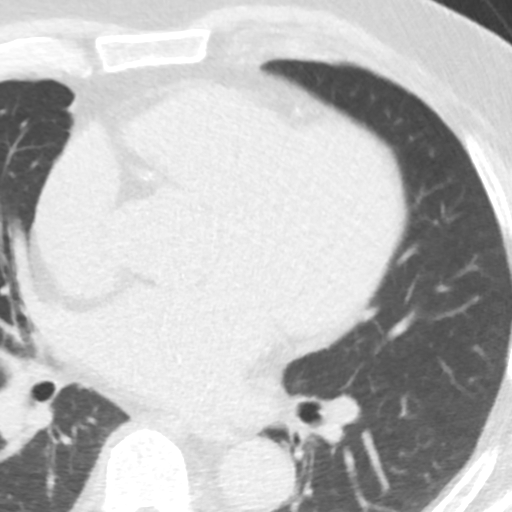
[im 25/38  vessel]
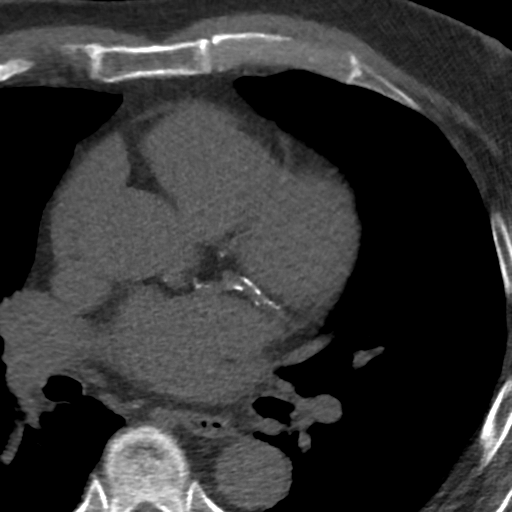
[im 29/38  vessel]
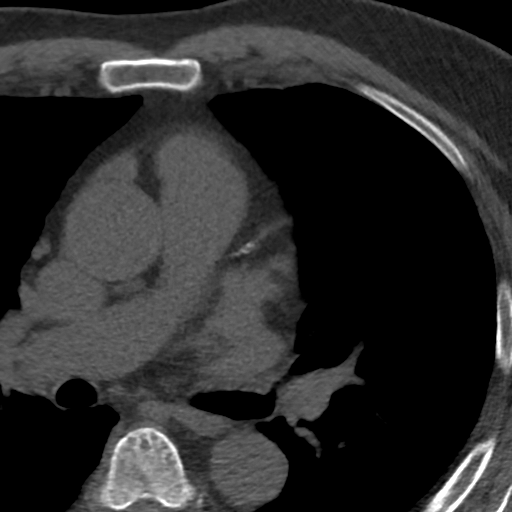
[im 33/38  vessel]
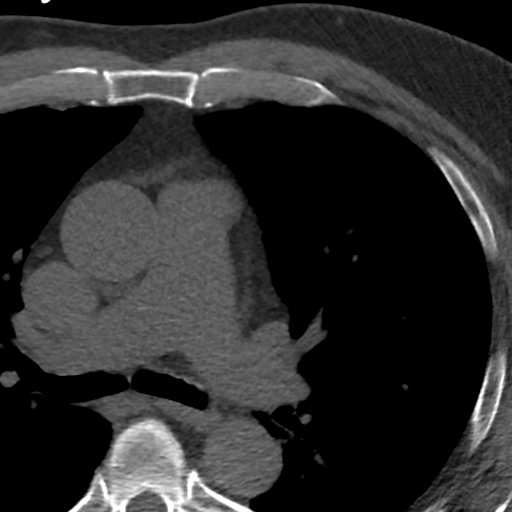

[Series 4: lung st 72 % · axial · 0.76mm/px · z∈[-214,-130]mm · 8 of 38 slices shown]
[im 5/38  lung]
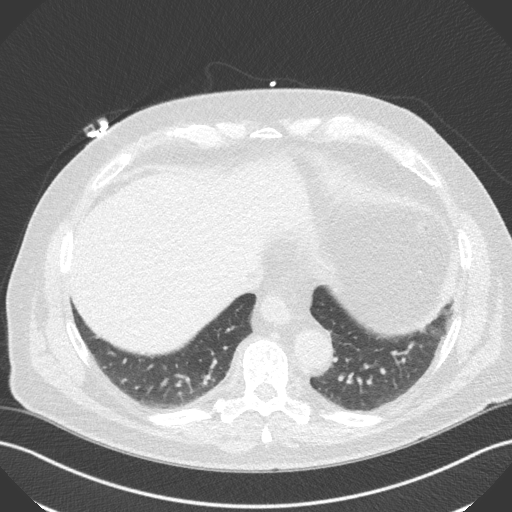
[im 9/38  lung]
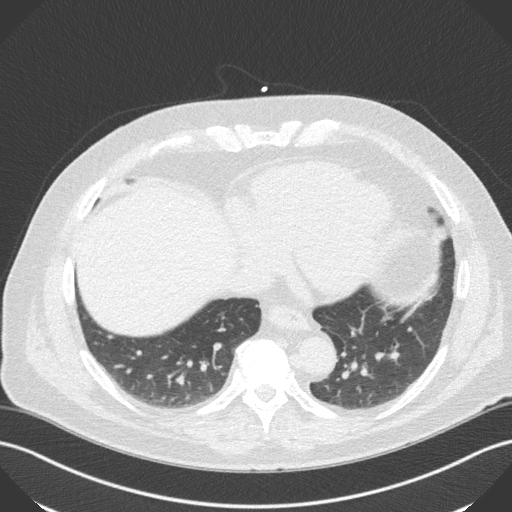
[im 13/38  lung]
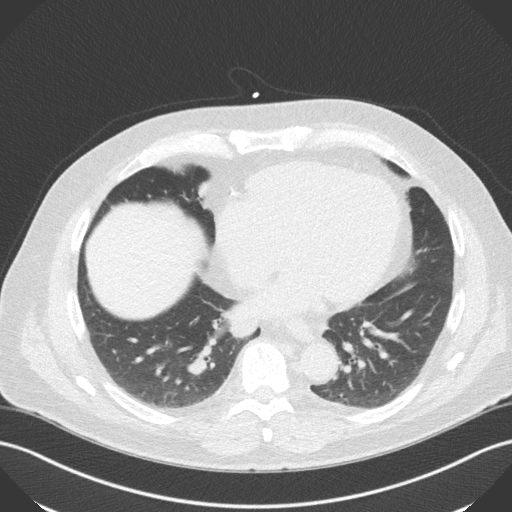
[im 17/38  lung]
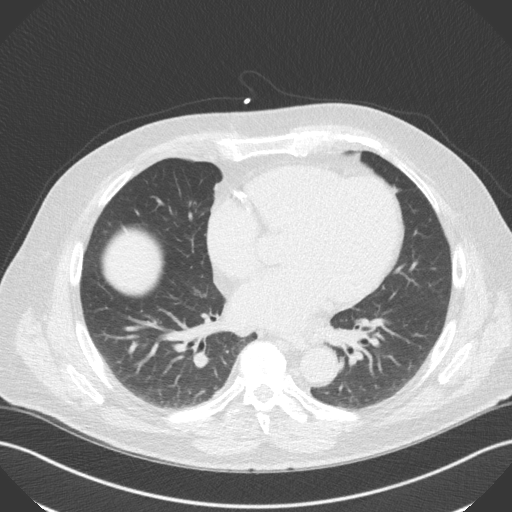
[im 21/38  lung]
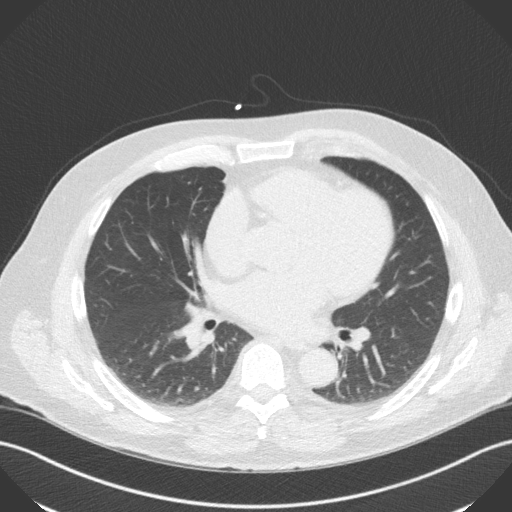
[im 25/38  lung]
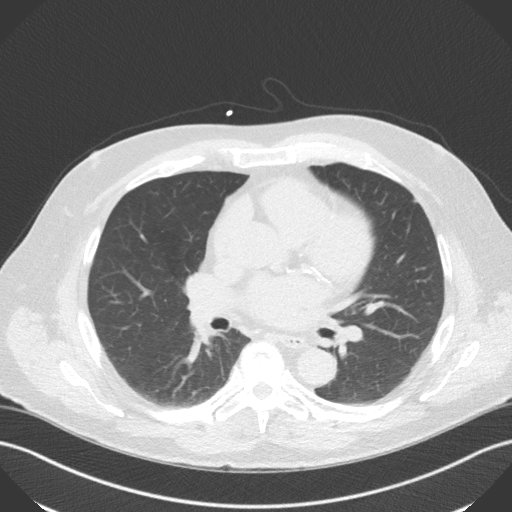
[im 29/38  lung]
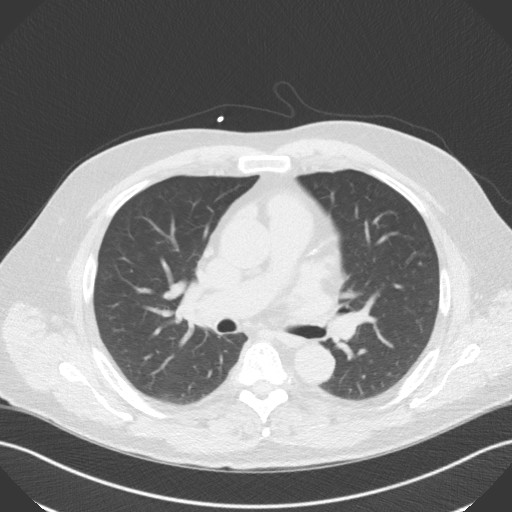
[im 33/38  lung]
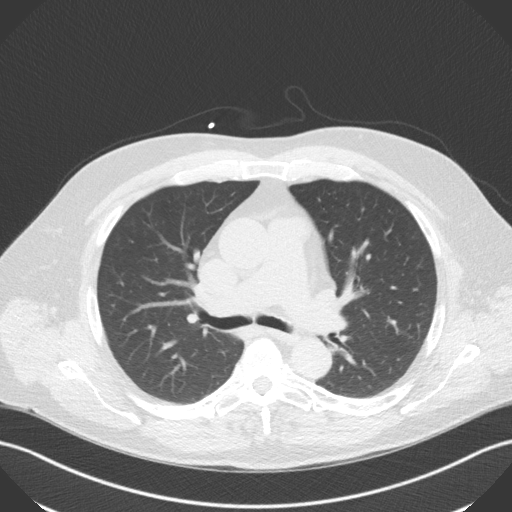

[16 of 20 positions shown; findings below may reference images not displayed]

FINDINGS: Vascular: No significant vascular findings. Normal heart size. No
pericardial effusion.

Mediastinum/Nodes: No enlarged mediastinal or axillary lymph nodes.
Thyroid gland, trachea, and esophagus demonstrate no significant
findings. Small hiatal hernia.

Lungs/Pleura: Lungs are clear. No pleural effusion or pneumothorax.
Small pulmonary nodule within the left upper lobe measures 4 mm,
image [DATE].

Upper Abdomen: No acute abnormality.

Musculoskeletal: No chest wall mass or suspicious bone lesions
identified.
IMPRESSION: 1. Small pulmonary nodule in the left upper lobe measures 4 mm. No
follow-up needed if patient is low-risk. Non-contrast chest CT can
be considered in 12 months if patient is high-risk. This
recommendation follows the consensus statement: Guidelines for
Management of Incidental Pulmonary Nodules Detected on CT Images:
FINDINGS: Non-cardiac: See separate report from [REDACTED].

Ascending Aorta: Normal size, trivial calcifications.

Pericardium: Normal.

Coronary arteries: Normal origin.
IMPRESSION: Coronary calcium score of 999. This was 92 percentile for age and
sex matched control.

*** End of Addendum ***
EXAM:
OVER-READ INTERPRETATION  CT CHEST

The following report is an over-read performed by radiologist Dr.
over-read does not include interpretation of cardiac or coronary
anatomy or pathology. The coronary calcium score interpretation by
the cardiologist is attached.
FINDINGS: Vascular: No significant vascular findings. Normal heart size. No
pericardial effusion.

Mediastinum/Nodes: No enlarged mediastinal or axillary lymph nodes.
Thyroid gland, trachea, and esophagus demonstrate no significant
findings. Small hiatal hernia.

Lungs/Pleura: Lungs are clear. No pleural effusion or pneumothorax.
Small pulmonary nodule within the left upper lobe measures 4 mm,
image [DATE].

Upper Abdomen: No acute abnormality.

Musculoskeletal: No chest wall mass or suspicious bone lesions
identified.
IMPRESSION: 1. Small pulmonary nodule in the left upper lobe measures 4 mm. No
follow-up needed if patient is low-risk. Non-contrast chest CT can
be considered in 12 months if patient is high-risk. This
recommendation follows the consensus statement: Guidelines for
Management of Incidental Pulmonary Nodules Detected on CT Images:

## 2022-01-18 ENCOUNTER — Other Ambulatory Visit: Payer: Self-pay

## 2022-01-18 MED ORDER — EZETIMIBE 10 MG PO TABS: 10.0000 mg | ORAL_TABLET | Freq: Every day | ORAL | 0 refills | Status: DC

## 2022-02-24 ENCOUNTER — Ambulatory Visit: Payer: Medicare Other | Admitting: Cardiovascular Disease

## 2022-02-28 HISTORY — PX: CATARACT EXTRACTION: SUR2

## 2022-03-07 NOTE — Progress Notes (Signed)
Date:  03/10/2022   ID:  Keith Rossetti., DOB Mar 20, 1946, MRN [id number]  Patient Location:  [address] [place] [address]   Provider location:   [facility], [place] office  PCP:  System, Provider Not In  Cardiologist:  [name]  Chief Complaint  Patient presents with   12 month follow up     "Doing well." Medications reviewed by the patient verbally.      History of Present Illness:    Keith Commisso. is a 76 y.o. male past medical history of Hypertension Hyperlipidemia Former smoker, college days Mnire's disease Prior water skiing accident DDD, MRI revealing multilevel spondylosis particularly at the C5-C6 without herniation CT calcium 03/2019: Coronary calcium score of 999. This was 35 percentile Presenting for follow-up of his coronary disease, arm pain, mild shortness of breath, chest pain, strong family history of coronary disease  Last seen in clinic by myself December 2021  In follow-up today reports feeling well, no chest pain or shortness of breath concerning for angina Recently built a boat dock with no anginal symptoms  Prior history of Mnire's disease, No recent vertigo symptoms  Prior CT coronary calcium discussed, calcium score thousand Three-vessel coronary calcification noted Minimal in the aorta  Tolerating Crestor 10 mg daily, not on Zetia Trace lower extremity edema, on amlodipine Feels blood pressure stable  Lab work reviewed from primary care Total cholesterol 102 LDL 33 down from numbers in 2022 CBC and CMP normal  EKG personally reviewed by myself on todays visit Shows normal sinus rhythm rate 79 bpm no significant ST-T wave changes  Other past medical history reviewed At baseline reports a long history of vestibular problems, vertigo evaluated by both ENT and neurology for a recurrent vestibular problem with intermittent vertigo. He was also been seen at [facility] with a full workup  including an MRI which was normal. He was given meclizine and some other off label medication he uses as needed.  Parents passed away from cardiac issues  No diabetes, non-smoker  >10 yrs ago did a stress test   Past Medical History:  Diagnosis Date   Arthritis    CAD (coronary artery disease)    Diverticulosis    GERD (gastroesophageal reflux disease)    History of shingles 2000   R SHOULDER   HOH (hard of hearing)    Hypercalcemia    Hypertension    Pneumonia 05/2019   Skin cancer 2015   face   Vertigo    Past Surgical History:  Procedure Laterality Date   CATARACT EXTRACTION Left 02/28/2022   ELBOW SURGERY Right    FOOT SURGERY Right    big toe   HAND SURGERY Right    thumb and middle finger   HERNIA REPAIR Left 05/12/2005   Laparoscopic, Proceed mesh, Milus Mallick, M.D.; [place], [place] Left 06/17/2018   Left groin exploration with resection of large lipoma the cord.  Medium PerFix plug Surgeon: Robert Bellow, MD;  Location: [facility] ORS;  Service: General;  Laterality: Left;   KNEE SURGERY Right    x2   RECONSTRUCTION OF EYELID Right 08/15/2019   Procedure: OCULAR SURFACE RECONSTRUCTION, AMNIOTIC MEMBRANCE TRANSPLANTATION;  Surgeon: Birder Robson, MD;  Location: [facility] ORS;  Service: Ophthalmology;  Laterality: Right;   SHOULDER SURGERY Left    SKIN CANCER EXCISION  2015   face     Current Meds  Medication Sig  amLODipine (NORVASC) 5 MG tablet Take 5 mg by mouth every morning.    Betahistine HCl (BETAHISTINE DIHYDROCHLORIDE) POWD Take 24 mg by mouth daily. [place] [employer]   brimonidine (ALPHAGAN) 0.2 % ophthalmic solution 1 drop 2 (two) times daily.   Coenzyme Q10 200 MG capsule Take by mouth.   diazepam (VALIUM) 2 MG tablet Take 2 mg by mouth 2 (two) times daily as needed (for dizziness/vertigo).   dorzolamide (TRUSOPT) 2 % ophthalmic solution 1 drop 2 (two) times daily.   esomeprazole  (NEXIUM) 40 MG capsule Take 40 mg by mouth daily at 6 (six) AM.   Methylcellulose, Laxative, (CITRUCEL PO) Take 1 tablet by mouth daily.    Multiple Vitamin (MULTIVITAMIN WITH MINERALS) TABS tablet Take 1 tablet by mouth daily. Centrum Silver   naproxen (NAPROSYN) 500 MG tablet Take 500 mg by mouth daily.    ondansetron (ZOFRAN) 4 MG tablet Take 4 mg by mouth every 8 (eight) hours as needed for nausea or vomiting.   rosuvastatin (CRESTOR) 10 MG tablet Take 1 tablet by mouth once daily   triamterene-hydrochlorothiazide (MAXZIDE-25) 37.5-25 MG tablet Take 1 tablet by mouth daily.     Allergies:   Celebrex [celecoxib] and Sulfa antibiotics   Social History   Tobacco Use   Smoking status: Former    Years: 8.00    Types: Cigarettes    Quit date: 10/23/1978    Years since quitting: 43.4   Smokeless tobacco: Never   Tobacco comments:    weekends only  Vaping Use   Vaping Use: Never used  Substance Use Topics   Alcohol use: Yes    Comment: wine 3-4 times week   Drug use: Never     Family Hx: The patient's family history includes Heart attack in his father and mother; Heart disease in his father and mother.  ROS:   Please see the history of present illness.    Review of Systems  Constitutional: Negative.   HENT: Negative.    Respiratory: Negative.    Cardiovascular: Negative.   Gastrointestinal: Negative.   Musculoskeletal: Negative.   Neurological: Negative.   Psychiatric/Behavioral: Negative.    All other systems reviewed and are negative.   Labs/Other Tests and Data Reviewed:    Recent Labs: No results found for requested labs within last 8760 hours.   Recent Lipid Panel No results found for: CHOL, TRIG, HDL, CHOLHDL, LDLCALC, LDLDIRECT  Wt Readings from Last 3 Encounters:  03/10/22 214 lb 2 oz (97.1 kg)  09/27/20 216 lb (98 kg)  08/15/19 211 lb 10.3 oz (96 kg)     Exam:    Vital Signs: Vital signs may also be detailed in the HPI BP 130/72 (BP Location: Left  Arm, Patient Position: Sitting, Cuff Size: Normal)   Pulse 79   Ht '5\' 11"'$  (1.803 m)   Wt 214 lb 2 oz (97.1 kg)   SpO2 98%   BMI 29.86 kg/m    Constitutional:  oriented to person, place, and time. No distress.  HENT:  Head: Grossly normal Eyes:  no discharge. No scleral icterus.  Neck: No JVD, no carotid bruits  Cardiovascular: Regular rate and rhythm, no murmurs appreciated Pulmonary/Chest: Clear to auscultation bilaterally, no wheezes or rails Abdominal: Soft.  no distension.  no tenderness.  Musculoskeletal: Normal range of motion Neurological:  normal muscle tone. Coordination normal. No atrophy Skin: Skin warm and dry Psychiatric: normal affect, pleasant  ASSESSMENT & PLAN:    Chest pain of uncertain etiology Currently  with no symptoms of angina. No further workup at this time. Continue current medication regimen. Continue to treat cholesterol very aggressively to limit any progression of underlying coronary calcification Discussed anginal symptoms to watch for Currently very active  Mixed hyperlipidemia Markedly elevated calcium scoring, around 1000 92nd percentile Continue Crestor 10, numbers at goal  Meniere's disease, unspecified laterality Managed by [facility], ENT Rare symptoms  SOB (shortness of breath) Discussed anginal symptoms to watch for, For any further shortness of breath chest pain or arm pain, may need stress testing  Family history of premature coronary artery disease Aggressive management as above  Leg swelling Likely mild venous insufficiency, recommended compression hose For any worsening symptoms may need to change amlodipine to alternate blood pressure medication such as ARB   Total encounter time more than 30 minutes  Greater than 50% was spent in counseling and coordination of care with the patient   Signed, Ida Rogue, MD  03/10/2022 8:43 AM    [place] Office [address],  [place], [place] [address]

## 2022-03-10 ENCOUNTER — Ambulatory Visit: Payer: Medicare Other | Admitting: Cardiovascular Disease

## 2022-03-10 ENCOUNTER — Encounter: Payer: Self-pay | Admitting: Cardiovascular Disease

## 2022-03-10 VITALS — BP 130/72 | HR 79 | Ht 71.0 in | Wt 214.1 lb

## 2022-03-10 DIAGNOSIS — E782 Mixed hyperlipidemia: Secondary | ICD-10-CM | POA: Diagnosis not present

## 2022-03-10 DIAGNOSIS — I25118 Atherosclerotic heart disease of native coronary artery with other forms of angina pectoris: Secondary | ICD-10-CM | POA: Diagnosis not present

## 2022-03-10 DIAGNOSIS — R079 Chest pain, unspecified: Secondary | ICD-10-CM | POA: Diagnosis not present

## 2022-03-10 DIAGNOSIS — H8109 Meniere's disease, unspecified ear: Secondary | ICD-10-CM

## 2022-03-10 DIAGNOSIS — R0602 Shortness of breath: Secondary | ICD-10-CM

## 2022-03-10 MED ORDER — ROSUVASTATIN CALCIUM 10 MG PO TABS: 10.0000 mg | ORAL_TABLET | Freq: Every day | ORAL | 0 refills | Status: DC

## 2022-03-10 NOTE — Patient Instructions (Signed)
Medication Instructions:  No changes  If you need a refill on your cardiac medications before your next appointment, please call your pharmacy.   Lab work: No new labs needed  Testing/Procedures: No new testing needed  Follow-Up: At [facility], you and your health needs are our priority.  As part of our continuing mission to provide you with exceptional heart care, we have created designated Provider Care Teams.  These Care Teams include your primary Cardiologist (physician) and Advanced Practice Providers (APPs -  Physician Assistants and Nurse Practitioners) who all work together to provide you with the care you need, when you need it.  You will need a follow up appointment in 12 months  Providers on your designated Care Team:   Christopher Berge, NP Ryan Dunn, PA-C Cadence Furth, PA-C  COVID-19 Vaccine Information can be found at: [email or web address] For questions related to vaccine distribution or appointments, please email [email or web address] or call [phone].   

## 2022-04-11 ENCOUNTER — Encounter: Payer: Self-pay | Admitting: Internal Medicine

## 2022-04-12 ENCOUNTER — Encounter: Payer: Self-pay | Admitting: Internal Medicine

## 2022-04-12 ENCOUNTER — Ambulatory Visit: Payer: Medicare Other | Admitting: Anesthesiology

## 2022-04-12 ENCOUNTER — Encounter
Admission: RE | Disposition: A | Discharge status: Home / Self Care | Payer: Self-pay | Source: Home / Self Care | Attending: Internal Medicine

## 2022-04-12 ENCOUNTER — Ambulatory Visit
Admission: RE | Admit: 2022-04-12 | Discharge: 2022-04-12 | Disposition: A | Discharge status: Home / Self Care | Payer: Medicare Other | Attending: Internal Medicine | Admitting: Internal Medicine

## 2022-04-12 DIAGNOSIS — I2510 Atherosclerotic heart disease of native coronary artery without angina pectoris: Secondary | ICD-10-CM | POA: Diagnosis not present

## 2022-04-12 DIAGNOSIS — Z1211 Encounter for screening for malignant neoplasm of colon: Secondary | ICD-10-CM | POA: Diagnosis not present

## 2022-04-12 DIAGNOSIS — I10 Essential (primary) hypertension: Secondary | ICD-10-CM | POA: Diagnosis not present

## 2022-04-12 DIAGNOSIS — K219 Gastro-esophageal reflux disease without esophagitis: Secondary | ICD-10-CM | POA: Insufficient documentation

## 2022-04-12 DIAGNOSIS — K5909 Other constipation: Secondary | ICD-10-CM | POA: Diagnosis not present

## 2022-04-12 DIAGNOSIS — Z86010 Personal history of colonic polyps: Secondary | ICD-10-CM | POA: Insufficient documentation

## 2022-04-12 DIAGNOSIS — D123 Benign neoplasm of transverse colon: Secondary | ICD-10-CM | POA: Insufficient documentation

## 2022-04-12 DIAGNOSIS — K640 First degree hemorrhoids: Secondary | ICD-10-CM | POA: Insufficient documentation

## 2022-04-12 DIAGNOSIS — Z85828 Personal history of other malignant neoplasm of skin: Secondary | ICD-10-CM | POA: Diagnosis not present

## 2022-04-12 DIAGNOSIS — K5730 Diverticulosis of large intestine without perforation or abscess without bleeding: Secondary | ICD-10-CM | POA: Diagnosis not present

## 2022-04-12 DIAGNOSIS — Z79899 Other long term (current) drug therapy: Secondary | ICD-10-CM | POA: Diagnosis not present

## 2022-04-12 SURGERY — COLONOSCOPY WITH PROPOFOL
Anesthesia: General

## 2022-04-12 MED ORDER — PROPOFOL 500 MG/50ML IV EMUL
INTRAVENOUS | Status: DC | PRN
  Administered 2022-04-12: 200 ug/kg/min via INTRAVENOUS

## 2022-04-12 MED ORDER — PROPOFOL 10 MG/ML IV BOLUS
INTRAVENOUS | Status: DC | PRN
  Administered 2022-04-12: 60 mg via INTRAVENOUS

## 2022-04-12 MED ORDER — SODIUM CHLORIDE 0.9 % IV SOLN: INTRAVENOUS | Status: DC

## 2022-04-12 MED ORDER — SODIUM CHLORIDE 0.9 % IV SOLN: INTRAVENOUS | Status: DC | PRN

## 2022-04-12 NOTE — Interval H&P Note (Signed)
History and Physical Interval Note:  04/12/2022 10:06 AM  Keith Davidson.  has presented today for surgery, with the diagnosis of K59.09  - Chronic constipation Z86.010  - History of colon polyps.  The various methods of treatment have been discussed with the patient and family. After consideration of risks, benefits and other options for treatment, the patient has consented to  Procedure(s): COLONOSCOPY WITH PROPOFOL (N/A) as a surgical intervention.  The patient's history has been reviewed, patient examined, no change in status, stable for surgery.  I have reviewed the patient's chart and labs.  Questions were answered to the patient's satisfaction.     [place], [place]

## 2022-04-12 NOTE — Op Note (Signed)
[facility] Patient Name: Keith Davidson Procedure Date: 04/12/2022 9:50 AM MRN: [id number] Account #: [id number] Date of Birth: 04/02/1946 Admit Type: Outpatient Age: 76 Room: [facility] ENDO ROOM 2 Gender: Male Note Status: Finalized Instrument Name: Jasper Riling [id number] Procedure:             Colonoscopy Indications:           High risk colon cancer surveillance: Personal history                         of multiple (3 or more) adenomas Providers:             Benay Pike. Doyt Castellana MD, MD Medicines:             Propofol per Anesthesia Complications:         No immediate complications. Procedure:             Pre-Anesthesia Assessment:                        - The risks and benefits of the procedure and the                         sedation options and risks were discussed with the                         patient. All questions were answered and informed                         consent was obtained.                        - Patient identification and proposed procedure were                         verified prior to the procedure by the nurse. The                         procedure was verified in the procedure room.                        - ASA Grade Assessment: III - A patient with severe                         systemic disease.                        - After reviewing the risks and benefits, the patient                         was deemed in satisfactory condition to undergo the                         procedure.                        After obtaining informed consent, the colonoscope was                         passed under direct vision. Throughout the procedure,  the patient's blood pressure, pulse, and oxygen                         saturations were monitored continuously. The                         Colonoscope was introduced through the anus and                         advanced to the the cecum, identified by appendiceal                          orifice and ileocecal valve. The colonoscopy was                         performed without difficulty. The patient tolerated                         the procedure well. The quality of the bowel                         preparation was adequate. The ileocecal valve,                         appendiceal orifice, and rectum were photographed. Findings:      The perianal and digital rectal examinations were normal. Pertinent       negatives include normal sphincter tone and no palpable rectal lesions.      Non-bleeding internal hemorrhoids were found during retroflexion. The       hemorrhoids were Grade I (internal hemorrhoids that do not prolapse).      Many medium-mouthed diverticula were found in the sigmoid colon.      A 5 mm polyp was found in the transverse colon. The polyp was sessile.       The polyp was removed with a jumbo cold forceps. Resection and retrieval       were complete.      The exam was otherwise without abnormality. Impression:            - Non-bleeding internal hemorrhoids.                        - Diverticulosis in the sigmoid colon.                        - One 5 mm polyp in the transverse colon, removed with                         a jumbo cold forceps. Resected and retrieved.                        - The examination was otherwise normal. Recommendation:        - Patient has a contact number available for                         emergencies. The signs and symptoms of potential                         delayed complications were discussed with the patient.  Return to normal activities tomorrow. Written                         discharge instructions were provided to the patient.                        - Resume previous diet.                        - Continue present medications.                        - If polyps are benign or adenomatous without                         dysplasia, I will advise NO further colonoscopy due to                          advanced age and/or severe comorbidity.                        - Return to GI office PRN.                        - Telephone GI office for pathology results in 2 weeks.                        - The findings and recommendations were discussed with                         the patient. Procedure Code(s):     --- Professional ---                        [id number], Colonoscopy, flexible; with biopsy, single or                         multiple Diagnosis Code(s):     --- Professional ---                        K57.30, Diverticulosis of large intestine without                         perforation or abscess without bleeding                        K63.5, Polyp of colon                        K64.0, First degree hemorrhoids                        Z86.010, Personal history of colonic polyps CPT copyright 2019 American Medical Association. All rights reserved. The codes documented in this report are preliminary and upon coder review may  be revised to meet current compliance requirements. Efrain Sella MD, MD 04/12/2022 10:33:43 AM This report has been signed electronically. Number of Addenda: 0 Note Initiated On: 04/12/2022 9:50 AM Scope Withdrawal Time: 0 hours 6 minutes 55 seconds  Total Procedure Duration: 0 hours 13 minutes 36 seconds  Estimated Blood Loss:  Estimated blood loss: none.  [facility]

## 2022-04-12 NOTE — Transfer of Care (Signed)
Immediate Anesthesia Transfer of Care Note  Patient: Keith Davidson.  Procedure(s) Performed: COLONOSCOPY WITH PROPOFOL  Patient Location: PACU  Anesthesia Type:General  Level of Consciousness: sedated  Airway & Oxygen Therapy: Patient Spontanous Breathing and Patient connected to nasal cannula oxygen  Post-op Assessment: Report given to RN and Post -op Vital signs reviewed and stable  Post vital signs: Reviewed and stable  Last Vitals:  Vitals Value Taken Time  BP 111/57 04/12/22 1032  Temp 36.3 C 04/12/22 1032  Pulse 76 04/12/22 1034  Resp 10 04/12/22 1034  SpO2 98 % 04/12/22 1034  Vitals shown include unvalidated device data.  Last Pain:  Vitals:   04/12/22 1032  TempSrc: Temporal  PainSc: Asleep         Complications: No notable events documented.

## 2022-04-12 NOTE — Anesthesia Preprocedure Evaluation (Signed)
Anesthesia Evaluation  Patient identified by MRN, date of birth, ID band Patient awake    Reviewed: Allergy & Precautions, H&P , NPO status , Patient's Chart, lab work & pertinent test results, reviewed documented beta blocker date and time   Airway Mallampati: II   Neck ROM: full    Dental  (+) Poor Dentition   Pulmonary shortness of breath and with exertion, pneumonia, resolved, former smoker,    Pulmonary exam normal        Cardiovascular Exercise Tolerance: Good hypertension, On Medications + CAD  Normal cardiovascular exam Rhythm:regular Rate:Normal     Neuro/Psych negative neurological ROS  negative psych ROS   GI/Hepatic Neg liver ROS, GERD  ,  Endo/Other  negative endocrine ROS  Renal/GU negative Renal ROS  negative genitourinary   Musculoskeletal   Abdominal   Peds  Hematology negative hematology ROS (+)   Anesthesia Other Findings Past Medical History: No date: Arthritis No date: CAD (coronary artery disease) No date: Diverticulosis No date: GERD (gastroesophageal reflux disease) 2000: History of shingles     Comment:  R SHOULDER No date: HOH (hard of hearing) No date: Hypercalcemia No date: Hypertension 05/2019: Pneumonia 2015: Skin cancer     Comment:  face No date: Vertigo Past Surgical History: 02/28/2022: CATARACT EXTRACTION; Left No date: ELBOW SURGERY; Right No date: FOOT SURGERY; Right     Comment:  big toe No date: HAND SURGERY; Right     Comment:  thumb and middle finger 05/12/2005: HERNIA REPAIR; Left     Comment:  Laparoscopic, Proceed mesh, Milus Mallick, M.D.; [place], [place] 06/17/2018: INGUINAL HERNIA REPAIR; Left     Comment:  Left groin exploration with resection of large lipoma               the cord.  Medium PerFix plug Surgeon: Robert Bellow, MD;  Location: [facility] ORS;  Service: General;                Laterality:  Left; No date: KNEE SURGERY; Right     Comment:  x2 08/15/2019: RECONSTRUCTION OF EYELID; Right     Comment:  Procedure: OCULAR SURFACE RECONSTRUCTION, AMNIOTIC               MEMBRANCE TRANSPLANTATION;  Surgeon: Birder Robson,               MD;  Location: [facility] ORS;  Service: Ophthalmology;                Laterality: Right; No date: SHOULDER SURGERY; Left 2015: SKIN CANCER EXCISION     Comment:  face BMI    Body Mass Index: 29.15 kg/m     Reproductive/Obstetrics negative OB ROS                             Anesthesia Physical Anesthesia Plan  ASA: 3  Anesthesia Plan: General   Post-op Pain Management:    Induction:   PONV Risk Score and Plan:   Airway Management Planned:   Additional Equipment:   Intra-op Plan:   Post-operative Plan:   Informed Consent: I have reviewed the patients History and Physical, chart, labs and discussed the procedure including the risks, benefits and alternatives for the proposed anesthesia with the patient or authorized representative who  has indicated his/her understanding and acceptance.     Dental Advisory Given  Plan Discussed with: CRNA  Anesthesia Plan Comments:         Anesthesia Quick Evaluation

## 2022-04-12 NOTE — H&P (Signed)
Outpatient short stay form Pre-procedure 04/12/2022 10:06 AM Keith Davidson K. Alice Reichert, M.D.  Primary Physician: Adrian Prows, M.D.  Reason for visit:  Chronic constipation, personal history of colon polyps  History of present illness:  Keith Davidson is a 76 y.o. with hx of hypertension hyperlipidemia Mnire's disease, GERD, personal history of colon polyps. He moved from [place] to [place]. He lives with his wife. Patient signed medical release for old records. He does note that his last colonoscopy was in 2017 and he is interested in getting a repeat colonoscopy at age 38.  He has dieted and lost 20 lbs and put 5 lbs back on. He has a BM every 2-3 days. He is comfortable with this. He takes one Citrucel daily as a preventive. He tried Colace- no difference. He does not eat bad foods. No abdominal pain or hematochezia.   LPR/ HX of am nausea: He stopped the Nexium as he was having a little nausea in the am. He had a little indigestion if he eats cold food or real cold water. He has no chest pain, dysphagia, reflux, regurgitation, epigastric pain, nausea/vomiting, or melena.     Current Facility-Administered Medications:    0.9 %  sodium chloride infusion, , Intravenous, Continuous, [place], Benay Pike, MD, Last Rate: 20 mL/hr at 04/12/22 0938, New Bag at 04/12/22 0938  Medications Prior to Admission  Medication Sig Dispense Refill Last Dose   amLODipine (NORVASC) 5 MG tablet Take 5 mg by mouth every morning.   1 04/12/2022 at 0600   promethazine (PHENERGAN) 12.5 MG tablet Take 12.5 mg by mouth every 8 (eight) hours as needed for nausea or vomiting.      Betahistine HCl (BETAHISTINE DIHYDROCHLORIDE) POWD Take 24 mg by mouth daily. Keith Davidson      brimonidine (ALPHAGAN) 0.2 % ophthalmic solution 1 drop 2 (two) times daily.      Coenzyme Q10 200 MG capsule Take by mouth.      diazepam (VALIUM) 2 MG tablet Take 2 mg by mouth 2 (two) times daily as needed (for  dizziness/vertigo).      dorzolamide (TRUSOPT) 2 % ophthalmic solution 1 drop 2 (two) times daily.      esomeprazole (NEXIUM) 40 MG capsule Take 40 mg by mouth daily at 6 (six) AM.      Methylcellulose, Laxative, (CITRUCEL PO) Take 1 tablet by mouth daily.       Multiple Vitamin (MULTIVITAMIN WITH MINERALS) TABS tablet Take 1 tablet by mouth daily. Centrum Silver      naproxen (NAPROSYN) 500 MG tablet Take 500 mg by mouth daily.   3 04/10/2022   ondansetron (ZOFRAN) 4 MG tablet Take 4 mg by mouth every 8 (eight) hours as needed for nausea or vomiting.      rosuvastatin (CRESTOR) 10 MG tablet Take 1 tablet (10 mg total) by mouth daily. 90 tablet 0    triamterene-hydrochlorothiazide (MAXZIDE-25) 37.5-25 MG tablet Take 1 tablet by mouth daily.  11 04/09/2022     Allergies  Allergen Reactions   Celebrex [Celecoxib] Rash   Sulfa Antibiotics Rash     Past Medical History:  Diagnosis Date   Arthritis    CAD (coronary artery disease)    Diverticulosis    GERD (gastroesophageal reflux disease)    History of shingles 2000   R SHOULDER   HOH (hard of hearing)    Hypercalcemia    Hypertension    Pneumonia 05/2019   Skin cancer 2015   face   Vertigo  Review of systems:  Otherwise negative.    Physical Exam  Gen: Alert, oriented. Appears stated age.  HEENT: [place]/AT. PERRLA. Lungs: CTA, no wheezes. CV: RR nl S1, S2. Abd: soft, benign, no masses. BS+ Ext: No edema. Pulses 2+    Planned procedures: Proceed with colonoscopy. The patient understands the nature of the planned procedure, indications, risks, alternatives and potential complications including but not limited to bleeding, infection, perforation, damage to internal organs and possible oversedation/side effects from anesthesia. The patient agrees and gives consent to proceed.  Please refer to procedure notes for findings, recommendations and patient disposition/instructions.     Kunta Hilleary K. Alice Reichert,  M.D. Gastroenterology 04/12/2022  10:06 AM

## 2022-04-12 NOTE — Anesthesia Procedure Notes (Signed)
Date/Time: 04/12/2022 9:50 AM  Performed by: Nelda Marseille, CRNAPre-anesthesia Checklist: Patient identified, Emergency Drugs available, Suction available, Patient being monitored and Timeout performed Oxygen Delivery Method: Nasal cannula

## 2022-04-12 NOTE — Anesthesia Postprocedure Evaluation (Signed)
Anesthesia Post Note  Patient: Keith Davidson.  Procedure(s) Performed: COLONOSCOPY WITH PROPOFOL  Patient location during evaluation: PACU Anesthesia Type: General Level of consciousness: awake and alert Pain management: pain level controlled Vital Signs Assessment: post-procedure vital signs reviewed and stable Respiratory status: spontaneous breathing, nonlabored ventilation, respiratory function stable and patient connected to nasal cannula oxygen Cardiovascular status: blood pressure returned to baseline and stable Postop Assessment: no apparent nausea or vomiting Anesthetic complications: no   No notable events documented.   Last Vitals:  Vitals:   04/12/22 1042 04/12/22 1052  BP: 127/75 134/80  Pulse: 79 75  Resp: (!) 21 16  Temp:    SpO2: 100% 97%    Last Pain:  Vitals:   04/12/22 1052  TempSrc:   PainSc: 0-No pain                 Molli Barrows

## 2022-04-13 ENCOUNTER — Encounter: Payer: Self-pay | Admitting: Internal Medicine

## 2022-04-13 LAB — SURGICAL PATHOLOGY

## 2023-01-25 ENCOUNTER — Other Ambulatory Visit
Admission: RE | Admit: 2023-01-25 | Discharge: 2023-01-25 | Disposition: A | Discharge status: Home / Self Care | Payer: Medicare Other | Source: Ambulatory Visit | Attending: Infectious Diseases | Admitting: Infectious Diseases

## 2023-01-25 DIAGNOSIS — R600 Localized edema: Secondary | ICD-10-CM | POA: Insufficient documentation

## 2023-01-25 LAB — BRAIN NATRIURETIC PEPTIDE: B Natriuretic Peptide: 21.8 pg/mL (ref 0.0–100.0)

## 2023-07-09 ENCOUNTER — Encounter (INDEPENDENT_AMBULATORY_CARE_PROVIDER_SITE_OTHER): Payer: Medicare Other | Admitting: Vascular Surgery

## 2023-08-08 DIAGNOSIS — I872 Venous insufficiency (chronic) (peripheral): Secondary | ICD-10-CM | POA: Insufficient documentation

## 2023-08-08 NOTE — Progress Notes (Unsigned)
MRN : [id number]  Keith Davidson. is a 77 y.o. (02-08-1946) male who presents with chief complaint of legs hurt and swell.  History of Present Illness:   Patient is seen for evaluation of leg pain and leg swelling. The patient first noticed the swelling remotely. The swelling is associated with pain and discoloration. The pain and swelling worsens with prolonged dependency and improves with elevation. The pain is unrelated to activity.  The patient notes that in the morning the legs are significantly improved but they steadily worsened throughout the course of the day. The patient also notes a steady worsening of the discoloration in the ankle and shin area.   The patient denies claudication symptoms.  The patient denies symptoms consistent with rest pain.  The patient denies and extensive history of DJD and LS spine disease.  The patient has no had any past angiography, interventions or vascular surgery.  Elevation makes the leg symptoms better, dependency makes them much worse. There is no history of ulcerations. The patient denies any recent changes in medications.  The patient has not been wearing graduated compression.  The patient denies a history of DVT or PE. There is no prior history of phlebitis. There is no history of primary lymphedema.  No history of malignancies. No history of trauma or groin or pelvic surgery. There is no history of radiation treatment to the groin or pelvis  The patient denies amaurosis fugax or recent TIA symptoms. There are no recent neurological changes noted. The patient denies recent episodes of angina or shortness of breath  No outpatient medications have been marked as taking for the 08/09/23 encounter (Appointment) with Gilda Crease, Latina Craver, MD.    Past Medical History:  Diagnosis Date   Arthritis    CAD (coronary artery disease)    Diverticulosis    GERD (gastroesophageal reflux disease)    History of shingles 2000   R SHOULDER    HOH (hard of hearing)    Hypercalcemia    Hypertension    Pneumonia 05/2019   Skin cancer 2015   face   Vertigo     Past Surgical History:  Procedure Laterality Date   CATARACT EXTRACTION Left 02/28/2022   COLONOSCOPY WITH PROPOFOL N/A 04/12/2022   Procedure: COLONOSCOPY WITH PROPOFOL;  Surgeon: Toledo, Boykin Nearing, MD;  Location: [facility];  Service: Gastroenterology;  Laterality: N/A;   ELBOW SURGERY Right    FOOT SURGERY Right    big toe   HAND SURGERY Right    thumb and middle finger   HERNIA REPAIR Left 05/12/2005   Laparoscopic, Proceed mesh, Ivor Messier, M.D.; [facility], [place], [place]   INGUINAL HERNIA REPAIR Left 06/17/2018   Left groin exploration with resection of large lipoma the cord.  Medium PerFix plug Surgeon: Earline Mayotte, MD;  Location: [facility] ORS;  Service: General;  Laterality: Left;   KNEE SURGERY Right    x2   RECONSTRUCTION OF EYELID Right 08/15/2019   Procedure: OCULAR SURFACE RECONSTRUCTION, AMNIOTIC MEMBRANCE TRANSPLANTATION;  Surgeon: Galen Manila, MD;  Location: [facility] ORS;  Service: Ophthalmology;  Laterality: Right;   SHOULDER SURGERY Left    SKIN CANCER EXCISION  2015   face    Social History Social History   Tobacco Use   Smoking status: Former    Current packs/day: 0.00    Types: Cigarettes    Start date: 10/23/1970    Quit date: 10/23/1978    Years since quitting: 1.8  Smokeless tobacco: Never   Tobacco comments:    weekends only  Vaping Use   Vaping status: Never Used  Substance Use Topics   Alcohol use: Yes    Alcohol/week: 6.0 standard drinks of alcohol    Types: 3 Glasses of wine, 3 Shots of liquor per week    Comment: wine 3-4 times week   Drug use: Never    Family History Family History  Problem Relation Age of Onset   Heart attack Mother    Heart disease Mother    Heart disease Father    Heart attack Father     Allergies  Allergen Reactions   Celebrex [Celecoxib] Rash   Sulfa Antibiotics  Rash     REVIEW OF SYSTEMS (Negative unless checked)  Constitutional: [] Weight loss  [] Fever  [] Chills Cardiac: [] Chest pain   [] Chest pressure   [] Palpitations   [] Shortness of breath when laying flat   [] Shortness of breath with exertion. Vascular:  [] Pain in legs with walking   [x] Pain in legs at rest  [] History of DVT   [] Phlebitis   [x] Swelling in legs   [] Varicose veins   [] Non-healing ulcers Pulmonary:   [] Uses home oxygen   [] Productive cough   [] Hemoptysis   [] Wheeze  [] COPD   [] Asthma Neurologic:  [] Dizziness   [] Seizures   [] History of stroke   [] History of TIA  [] Aphasia   [] Vissual changes   [] Weakness or numbness in arm   [] Weakness or numbness in leg Musculoskeletal:   [] Joint swelling   [] Joint pain   [] Low back pain Hematologic:  [] Easy bruising  [] Easy bleeding   [] Hypercoagulable state   [] Anemic Gastrointestinal:  [] Diarrhea   [] Vomiting  [] Gastroesophageal reflux/heartburn   [] Difficulty swallowing. Genitourinary:  [] Chronic kidney disease   [] Difficult urination  [] Frequent urination   [] Blood in urine Skin:  [] Rashes   [] Ulcers  Psychological:  [] History of anxiety   []  History of major depression.  Physical Examination  There were no vitals filed for this visit. There is no height or weight on file to calculate BMI. Gen: WD/WN, NAD Head: [place]/AT, No temporalis wasting.  Ear/Nose/Throat: Hearing grossly intact, nares w/o erythema or drainage, pinna without lesions Eyes: PER, EOMI, sclera nonicteric.  Neck: Supple, no gross masses.  No JVD.  Pulmonary:  Good air movement, no audible wheezing, no use of accessory muscles.  Cardiac: RRR, precordium not hyperdynamic. Vascular:  scattered varicosities present bilaterally.  Moderate venous stasis changes to the legs bilaterally. 1+ soft pitting edema. CEAP [id number]   Vessel Right Left  Radial Palpable Palpable  Gastrointestinal: soft, non-distended. No guarding/no peritoneal signs.  Musculoskeletal: M/S 5/5  throughout.  No deformity.  Neurologic: CN 2-12 intact. Pain and light touch intact in extremities.  Symmetrical.  Speech is fluent. Motor exam as listed above. Psychiatric: Judgment intact, Mood & affect appropriate for pt's clinical situation. Dermatologic: Venous rashes no ulcers noted.  No changes consistent with cellulitis. Lymph : No lichenification or skin changes of chronic lymphedema.  CBC Lab Results  Component Value Date   WBC 7.7 06/11/2018   HGB 15.8 06/11/2018   HCT 45.7 06/11/2018   MCV 89.5 06/11/2018   PLT 276 06/11/2018    BMET    Component Value Date/Time   NA 139 06/11/2018 1213   K 4.1 06/11/2018 1213   CL 102 06/11/2018 1213   CO2 30 06/11/2018 1213   GLUCOSE 97 06/11/2018 1213   BUN 21 06/11/2018 1213   CREATININE 1.29 (H) 06/11/2018 1213  CALCIUM 9.7 06/11/2018 1213   GFRNONAA 54 (L) 06/11/2018 1213   GFRAA >60 06/11/2018 1213   CrCl cannot be calculated (Patient's most recent lab result is older than the maximum 21 days allowed.).  COAG No results found for: "INR", "PROTIME"  Radiology No results found.   Assessment/Plan 1. Chronic venous insufficiency Recommend:  No surgery or intervention at this point in time.  I have reviewed my discussion with the patient regarding venous insufficiency and why it causes symptoms. I have discussed with the patient the chronic skin changes that accompany venous insufficiency and the long term sequela such as ulceration. Patient will contnue wearing graduated compression stockings on a daily basis, as this has provided excellent control of his edema. The patient will put the stockings on first thing in the morning and removing them in the evening. The patient is reminded not to sleep in the stockings.  In addition, behavioral modification including elevation during the day will be initiated. Exercise is strongly encouraged.  Previous duplex ultrasound of the lower extremities shows normal deep system, no  significant superficial reflux was identified.  Given the patient's good control and lack of any problems regarding the venous insufficiency and lymphedema a lymph pump in not need at this time.    The patient will follow up with me PRN should anything change.  The patient voices agreement with this plan.  2. Mixed hyperlipidemia Continue statin as ordered and reviewed, no changes at this time    Levora Dredge, MD  08/08/2023 9:07 PM

## 2023-08-09 ENCOUNTER — Encounter (INDEPENDENT_AMBULATORY_CARE_PROVIDER_SITE_OTHER): Payer: Self-pay | Admitting: Vascular Surgery

## 2023-08-09 ENCOUNTER — Ambulatory Visit (INDEPENDENT_AMBULATORY_CARE_PROVIDER_SITE_OTHER): Payer: Medicare Other | Admitting: Vascular Surgery

## 2023-08-09 VITALS — BP 141/83 | HR 77 | Resp 16 | Ht 71.0 in | Wt 217.0 lb

## 2023-08-09 DIAGNOSIS — E782 Mixed hyperlipidemia: Secondary | ICD-10-CM | POA: Diagnosis not present

## 2023-08-09 DIAGNOSIS — I872 Venous insufficiency (chronic) (peripheral): Secondary | ICD-10-CM

## 2023-11-26 ENCOUNTER — Ambulatory Visit: Payer: Medicare Other | Attending: Cardiovascular Disease | Admitting: Cardiovascular Disease

## 2023-11-26 ENCOUNTER — Encounter: Payer: Self-pay | Admitting: Cardiovascular Disease

## 2023-11-26 VITALS — BP 130/70 | HR 78 | Ht 71.0 in | Wt 222.2 lb

## 2023-11-26 DIAGNOSIS — E782 Mixed hyperlipidemia: Secondary | ICD-10-CM

## 2023-11-26 DIAGNOSIS — R079 Chest pain, unspecified: Secondary | ICD-10-CM

## 2023-11-26 DIAGNOSIS — R0602 Shortness of breath: Secondary | ICD-10-CM | POA: Diagnosis not present

## 2023-11-26 DIAGNOSIS — I25118 Atherosclerotic heart disease of native coronary artery with other forms of angina pectoris: Secondary | ICD-10-CM | POA: Diagnosis not present

## 2023-11-26 DIAGNOSIS — I872 Venous insufficiency (chronic) (peripheral): Secondary | ICD-10-CM

## 2023-11-26 MED ORDER — ROSUVASTATIN CALCIUM 10 MG PO TABS: 10.0000 mg | ORAL_TABLET | Freq: Every day | ORAL | 3 refills | Status: AC

## 2023-11-26 MED ORDER — LOSARTAN POTASSIUM 50 MG PO TABS: 50.0000 mg | ORAL_TABLET | Freq: Every day | ORAL | 3 refills | Status: AC

## 2023-11-26 NOTE — Progress Notes (Signed)
Date:  11/26/2023   ID:  [name], DOB [birth year], MRN [id number]  Patient Location:  [address] [place] [address]   Provider location:   [facility], [place] office  PCP:  Mick Sell, MD  Cardiologist:  [facility]  Chief Complaint  Patient presents with   12 month follow up     "Doing well."     History of Present Illness:    Keith Davidson. is a 78 y.o. male past medical history of Hypertension Hyperlipidemia Former smoker, college days Mnire's disease Prior water skiing accident DDD, MRI revealing multilevel spondylosis particularly at the C5-C6 without herniation CT calcium 03/2019: Coronary calcium score of 999. This was 92 percentile Presenting for follow-up of his coronary disease, arm pain, mild shortness of breath, chest pain, strong family history of coronary disease  Last seen in clinic by myself 5/23  Active at baseline, likes to go hunting Walking on the track Uses tools at home, does jobs at [employer] the dog Denies significant shortness of breath or chest pain  Prior history of Mnire's disease, No recent vertigo symptoms  Prior CT coronary calcium discussed,  Calcium score 999 Three-vessel coronary calcification noted Minimal in the aorta  Tolerating Crestor 10 mg daily, not on Zetia  Leg swelling resolved by holding amlodipine Blood pressure well-controlled  Lab work reviewed from primary care Total cholesterol 134 LDL 50  CBC and CMP normal  EKG personally reviewed by myself on todays visit EKG Interpretation Date/Time:  Monday November 26 2023 09:56:48 EST Ventricular Rate:  78 PR Interval:  176 QRS Duration:  98 QT Interval:  388 QTC Calculation: 442 R Axis:   9  Text Interpretation: Normal sinus rhythm Normal ECG When compared with ECG of 11-Jun-2018 12:12, No significant change was found Confirmed by Julien Nordmann [phone]) on 11/26/2023 10:08:19 AM    Other  past medical history reviewed At baseline reports a long history of vestibular problems, vertigo evaluated by both ENT and neurology for a recurrent vestibular problem with intermittent vertigo. He was also been seen at [facility] with a full workup including an MRI which was normal. He was given meclizine and some other off label medication he uses as needed.  Parents passed away from cardiac issues  No diabetes, non-smoker  >10 yrs ago did a stress test   Past Medical History:  Diagnosis Date   Arthritis    CAD (coronary artery disease)    Diverticulosis    GERD (gastroesophageal reflux disease)    History of shingles 2000   R SHOULDER   HOH (hard of hearing)    Hypercalcemia    Hypertension    Pneumonia 05/2019   Skin cancer 2015   face   Vertigo    Past Surgical History:  Procedure Laterality Date   CATARACT EXTRACTION Left 02/28/2022   COLONOSCOPY WITH PROPOFOL N/A 04/12/2022   Procedure: COLONOSCOPY WITH PROPOFOL;  Surgeon: Toledo, Boykin Nearing, MD;  Location: [facility];  Service: Gastroenterology;  Laterality: N/A;   ELBOW SURGERY Right    FOOT SURGERY Right    big toe   HAND SURGERY Right    thumb and middle finger   HERNIA REPAIR Left 05/12/2005   Laparoscopic, Proceed mesh, Ivor Messier, M.D.; [facility], [place], [place]   INGUINAL HERNIA REPAIR Left 06/17/2018   Left groin exploration with resection of large lipoma the cord.  Medium PerFix plug Surgeon: Earline Mayotte,  MD;  Location: [facility];  Service: General;  Laterality: Left;   KNEE SURGERY Right    x2   RECONSTRUCTION OF EYELID Right 08/15/2019   Procedure: OCULAR SURFACE RECONSTRUCTION, AMNIOTIC MEMBRANCE TRANSPLANTATION;  Surgeon: Galen Manila, MD;  Location: [facility];  Service: Ophthalmology;  Laterality: Right;   SHOULDER SURGERY Left    SKIN CANCER EXCISION  2015   face     Current Meds  Medication Sig   Betahistine HCl (BETAHISTINE DIHYDROCHLORIDE) POWD Take 24 mg by mouth daily.  [facility] Compounds   brimonidine (ALPHAGAN) 0.2 % ophthalmic solution 1 drop 2 (two) times daily.   Coenzyme Q10 200 MG capsule Take by mouth.   diazepam (VALIUM) 2 MG tablet Take 2 mg by mouth 2 (two) times daily as needed (for dizziness/vertigo).   dorzolamide (TRUSOPT) 2 % ophthalmic solution 1 drop 2 (two) times daily.   esomeprazole (NEXIUM) 40 MG capsule Take 40 mg by mouth daily at 6 (six) AM.   losartan (COZAAR) 50 MG tablet Take 1 tablet by mouth daily.   Multiple Vitamin (MULTIVITAMIN WITH MINERALS) TABS tablet Take 1 tablet by mouth daily. Centrum Silver   naproxen (NAPROSYN) 500 MG tablet Take 500 mg by mouth daily.    ondansetron (ZOFRAN) 4 MG tablet Take 4 mg by mouth every 8 (eight) hours as needed for nausea or vomiting.   promethazine (PHENERGAN) 12.5 MG tablet Take 12.5 mg by mouth every 8 (eight) hours as needed for nausea or vomiting.   rosuvastatin (CRESTOR) 10 MG tablet Take 1 tablet (10 mg total) by mouth daily.   triamterene-hydrochlorothiazide (MAXZIDE-25) 37.5-25 MG tablet Take 1 tablet by mouth daily.     Allergies:   Sulfa antibiotics and Celebrex [celecoxib]   Social History   Tobacco Use   Smoking status: Former    Current packs/day: 0.00    Types: Cigarettes    Start date: 10/23/1970    Quit date: 10/23/1978    Years since quitting: 45.1   Smokeless tobacco: Never   Tobacco comments:    weekends only  Vaping Use   Vaping status: Never Used  Substance Use Topics   Alcohol use: Yes    Alcohol/week: 6.0 standard drinks of alcohol    Types: 3 Glasses of wine, 3 Shots of liquor per week    Comment: wine 3-4 times week   Drug use: Never     Family Hx: The patient's family history includes Heart attack in his father and mother; Heart disease in his father and mother.  ROS:   Please see the history of present illness.    Review of Systems  Constitutional: Negative.   HENT: Negative.    Respiratory: Negative.    Cardiovascular: Negative.    Gastrointestinal: Negative.   Musculoskeletal: Negative.   Neurological: Negative.   Psychiatric/Behavioral: Negative.    All other systems reviewed and are negative.    Labs/Other Tests and Data Reviewed:    Recent Labs: 01/25/2023: B Natriuretic Peptide 21.8   Recent Lipid Panel No results found for: "CHOL", "TRIG", "HDL", "CHOLHDL", "LDLCALC", "LDLDIRECT"  Wt Readings from Last 3 Encounters:  11/26/23 222 lb 4 oz (100.8 kg)  08/09/23 217 lb (98.4 kg)  04/12/22 209 lb (94.8 kg)     Exam:    Vital Signs: Vital signs may also be detailed in the HPI BP 130/70 (BP Location: Left Arm, Patient Position: Sitting, Cuff Size: Normal)   Pulse 78   Ht 5\' 11"  (1.803 m)   Wt  222 lb 4 oz (100.8 kg)   SpO2 96%   BMI 31.00 kg/m   Constitutional:  oriented to person, place, and time. No distress.  HENT:  Head: Grossly normal Eyes:  no discharge. No scleral icterus.  Neck: No JVD, no carotid bruits  Cardiovascular: Regular rate and rhythm, no murmurs appreciated Pulmonary/Chest: Clear to auscultation bilaterally, no wheezes or rails Abdominal: Soft.  no distension.  no tenderness.  Musculoskeletal: Normal range of motion Neurological:  normal muscle tone. Coordination normal. No atrophy Skin: Skin warm and dry Psychiatric: normal affect, pleasant  ASSESSMENT & PLAN:    Chest pain of uncertain etiology Calcium score 1000 Currently with no symptoms of angina. No further workup at this time. Continue current medication regimen.  Mixed hyperlipidemia Markedly elevated calcium scoring, around 1000 92nd percentile Continue Crestor 10, cholesterol at goal  Meniere's disease, unspecified laterality Managed by [facility], ENT Symptoms stable  SOB (shortness of breath) Mild shortness of breath on heavy exertion No concerning for stable angina  Leg swelling Symptoms resolved by holding amlodipine, changing to ARB  Essential hypertension Continue losartan, triamterene  hydrochlorothiazide Blood pressure is well controlled on today's visit. No changes made to the medications.  Signed, Julien Nordmann, MD  11/26/2023 10:07 AM    [facility] [address], [place], [place] [address]

## 2023-11-26 NOTE — Patient Instructions (Signed)
 Medication Instructions:  No changes  If you need a refill on your cardiac medications before your next appointment, please call your pharmacy.   Lab work: No new labs needed  Testing/Procedures: No new testing needed  Follow-Up: At [facility], you and your health needs are our priority.  As part of our continuing mission to provide you with exceptional heart care, we have created designated Provider Care Teams.  These Care Teams include your primary Cardiologist (physician) and Advanced Practice Providers (APPs -  Physician Assistants and Nurse Practitioners) who all work together to provide you with the care you need, when you need it.  You will need a follow up appointment in 12 months  Providers on your designated Care Team:   Nicolasa Ducking, NP Eula Listen, PA-C Cadence Fransico Michael, [place]  COVID-19 Vaccine Information can be found at: [email or web address] For questions related to vaccine distribution or appointments, please email [email or web address] or call [phone].

## 2024-02-18 ENCOUNTER — Encounter: Payer: Self-pay | Admitting: Infectious Diseases

## 2024-02-18 ENCOUNTER — Other Ambulatory Visit: Payer: Self-pay | Admitting: Infectious Diseases

## 2024-02-18 DIAGNOSIS — R0789 Other chest pain: Secondary | ICD-10-CM

## 2024-02-18 DIAGNOSIS — R5383 Other fatigue: Secondary | ICD-10-CM

## 2024-03-04 ENCOUNTER — Ambulatory Visit
Admission: RE | Admit: 2024-03-04 | Discharge: 2024-03-04 | Disposition: A | Discharge status: Home / Self Care | Source: Ambulatory Visit | Attending: Infectious Diseases | Admitting: Infectious Diseases

## 2024-03-04 DIAGNOSIS — R5383 Other fatigue: Secondary | ICD-10-CM | POA: Insufficient documentation

## 2024-03-04 DIAGNOSIS — R0789 Other chest pain: Secondary | ICD-10-CM | POA: Insufficient documentation

## 2024-03-04 MED ORDER — REGADENOSON 0.4 MG/5ML IV SOLN
0.4000 mg | Freq: Once | INTRAVENOUS | Status: AC
  Administered 2024-03-04: 0.4 mg via INTRAVENOUS

## 2024-03-04 MED ORDER — TECHNETIUM TC 99M TETROFOSMIN IV KIT
10.5500 | PACK | Freq: Once | INTRAVENOUS | Status: AC | PRN
  Administered 2024-03-04: 10.55 via INTRAVENOUS

## 2024-03-04 MED ORDER — TECHNETIUM TC 99M TETROFOSMIN IV KIT
32.0600 | PACK | Freq: Once | INTRAVENOUS | Status: AC | PRN
  Administered 2024-03-04: 32.06 via INTRAVENOUS

## 2024-03-06 LAB — NM MYOCAR MULTI W/SPECT W/WALL MOTION / EF
Base ST Depression (mm): 0 mm
Estimated workload: 1
Exercise duration (min): 1 min
Exercise duration (sec): 0 s
LV dias vol: 74 mL (ref 62–150)
LV sys vol: 11 mL
MPHR: 142 {beats}/min
Nuc Stress EF: 85 %
Peak HR: 94 {beats}/min
Percent HR: 66 %
Rest HR: 66 {beats}/min
Rest Nuclear Isotope Dose: 10.6 mCi
SDS: 1
SRS: 9
SSS: 2
ST Depression (mm): 0 mm
Stress Nuclear Isotope Dose: 32.1 mCi
TID: 0.83

## 2024-03-11 ENCOUNTER — Encounter (INDEPENDENT_AMBULATORY_CARE_PROVIDER_SITE_OTHER): Payer: Self-pay

## 2024-07-15 ENCOUNTER — Encounter: Payer: Self-pay | Admitting: Infectious Diseases

## 2024-07-16 ENCOUNTER — Encounter: Payer: Self-pay | Admitting: Infectious Diseases

## 2024-07-16 DIAGNOSIS — N1831 Chronic kidney disease, stage 3a: Secondary | ICD-10-CM

## 2024-07-18 ENCOUNTER — Other Ambulatory Visit: Payer: Self-pay | Admitting: Infectious Diseases

## 2024-07-18 ENCOUNTER — Encounter: Payer: Self-pay | Admitting: Infectious Diseases

## 2024-07-18 DIAGNOSIS — N1831 Chronic kidney disease, stage 3a: Secondary | ICD-10-CM

## 2024-07-18 DIAGNOSIS — R7989 Other specified abnormal findings of blood chemistry: Secondary | ICD-10-CM

## 2024-07-18 DIAGNOSIS — I25118 Atherosclerotic heart disease of native coronary artery with other forms of angina pectoris: Secondary | ICD-10-CM

## 2024-07-18 DIAGNOSIS — H8110 Benign paroxysmal vertigo, unspecified ear: Secondary | ICD-10-CM

## 2024-07-18 DIAGNOSIS — I10 Essential (primary) hypertension: Secondary | ICD-10-CM

## 2024-08-05 ENCOUNTER — Other Ambulatory Visit: Payer: Self-pay | Admitting: Orthopedic Surgery

## 2024-08-05 ENCOUNTER — Ambulatory Visit
Admission: RE | Admit: 2024-08-05 | Discharge: 2024-08-05 | Disposition: A | Discharge status: Home / Self Care | Source: Ambulatory Visit | Attending: Orthopedic Surgery | Admitting: Orthopedic Surgery

## 2024-08-05 DIAGNOSIS — R2242 Localized swelling, mass and lump, left lower limb: Secondary | ICD-10-CM | POA: Diagnosis present

## 2024-08-20 ENCOUNTER — Other Ambulatory Visit: Payer: Self-pay | Admitting: Orthopedic Surgery

## 2024-08-20 ENCOUNTER — Ambulatory Visit
Admission: RE | Admit: 2024-08-20 | Discharge: 2024-08-20 | Disposition: A | Discharge status: Home / Self Care | Source: Ambulatory Visit | Attending: Orthopedic Surgery | Admitting: Orthopedic Surgery

## 2024-08-20 ENCOUNTER — Ambulatory Visit
Admission: RE | Admit: 2024-08-20 | Discharge: 2024-08-20 | Disposition: A | Source: Ambulatory Visit | Attending: Orthopedic Surgery | Admitting: Orthopedic Surgery

## 2024-08-20 DIAGNOSIS — M25562 Pain in left knee: Secondary | ICD-10-CM

## 2024-08-20 DIAGNOSIS — R2242 Localized swelling, mass and lump, left lower limb: Secondary | ICD-10-CM | POA: Diagnosis present
